# Patient Record
Sex: Female | Born: 1990 | Hispanic: No | Marital: Married | Smoking: Never smoker
Health system: Southern US, Community
[De-identification: ages and names within clinical notes are randomized; demographics above are authoritative.]

## PROBLEM LIST (undated history)

## (undated) ENCOUNTER — Inpatient Hospital Stay (HOSPITAL_COMMUNITY): Payer: Self-pay

## (undated) DIAGNOSIS — D649 Anemia, unspecified: Secondary | ICD-10-CM

## (undated) DIAGNOSIS — F4310 Post-traumatic stress disorder, unspecified: Secondary | ICD-10-CM

## (undated) DIAGNOSIS — D563 Thalassemia minor: Secondary | ICD-10-CM

## (undated) DIAGNOSIS — J45909 Unspecified asthma, uncomplicated: Secondary | ICD-10-CM

## (undated) DIAGNOSIS — A159 Respiratory tuberculosis unspecified: Secondary | ICD-10-CM

## (undated) DIAGNOSIS — A048 Other specified bacterial intestinal infections: Secondary | ICD-10-CM

## (undated) HISTORY — DX: Unspecified asthma, uncomplicated: J45.909

## (undated) HISTORY — DX: Thalassemia minor: D56.3

## (undated) HISTORY — PX: NO PAST SURGERIES: SHX2092

## (undated) NOTE — Anesthesia Preprocedure Evaluation (Signed)
 Formatting of this note might be different from the original. * No procedures listed * * No surgeons listed * * No Diagnosis Codes entered *  Review of Patient Medical Status (Active or History)  Heather Stephenson is a 41f G41P4 @ unknown gestational age (estimated 38w per OB provider physical assessment) here for ctx and leaking of bloody fluid.  Hx scant PNC, mild asthma, post partum hemorrhage  General Information (+) Pregnancy Pt denies alcohol use, Pt denies substance use  Family Anesthesia History  No family hx of: anesthesia complications  Cardiovascular  No hx of: HTN  Respiratory  (+) Asthma  Endocrine  No hx of: DM1, DM2  Post Partum  (+) Hemorrhage  Deferred Airway/Physical Exam  Deferred until day of surgery  Anesthesia Plan  Patient medications and allergies reviewed  ASA physical status: II  Beta blockers not prescribed to patient prior to admission  Anesthesia plan deferred  Lab: Pending ECG: None ordered/indicated Image: None ordered/indicated  Blood type and screen: Pending  Interview status: Chart review only      Electronically signed by Lenette Juliene Rush, CRNA at 10/20/2022  9:42 AM PDT

---

## 2014-05-18 ENCOUNTER — Inpatient Hospital Stay (HOSPITAL_COMMUNITY)
Admission: AD | Admit: 2014-05-18 | Discharge: 2014-05-18 | Disposition: A | Discharge status: Home / Self Care | Payer: Self-pay | Source: Ambulatory Visit | Attending: Obstetrics & Gynecology | Admitting: Obstetrics & Gynecology

## 2014-05-18 ENCOUNTER — Encounter (HOSPITAL_COMMUNITY): Payer: Self-pay | Admitting: *Deleted

## 2014-05-18 ENCOUNTER — Inpatient Hospital Stay (HOSPITAL_COMMUNITY): Payer: Self-pay

## 2014-05-18 DIAGNOSIS — O26899 Other specified pregnancy related conditions, unspecified trimester: Secondary | ICD-10-CM

## 2014-05-18 DIAGNOSIS — O26891 Other specified pregnancy related conditions, first trimester: Secondary | ICD-10-CM | POA: Insufficient documentation

## 2014-05-18 DIAGNOSIS — D509 Iron deficiency anemia, unspecified: Secondary | ICD-10-CM

## 2014-05-18 DIAGNOSIS — Z3A01 Less than 8 weeks gestation of pregnancy: Secondary | ICD-10-CM | POA: Insufficient documentation

## 2014-05-18 DIAGNOSIS — R109 Unspecified abdominal pain: Secondary | ICD-10-CM | POA: Insufficient documentation

## 2014-05-18 LAB — CBC
HCT: 30.8 % — ABNORMAL LOW (ref 36.0–46.0)
Hemoglobin: 10 g/dL — ABNORMAL LOW (ref 12.0–15.0)
MCH: 20.5 pg — ABNORMAL LOW (ref 26.0–34.0)
MCHC: 32.5 g/dL (ref 30.0–36.0)
MCV: 63.1 fL — ABNORMAL LOW (ref 78.0–100.0)
Platelets: 255 10*3/uL (ref 150–400)
RBC: 4.88 MIL/uL (ref 3.87–5.11)
RDW: 15.4 % (ref 11.5–15.5)
WBC: 8.7 10*3/uL (ref 4.0–10.5)

## 2014-05-18 LAB — URINALYSIS, ROUTINE W REFLEX MICROSCOPIC
Bilirubin Urine: NEGATIVE
Glucose, UA: NEGATIVE mg/dL
Hgb urine dipstick: NEGATIVE
Ketones, ur: NEGATIVE mg/dL
Leukocytes, UA: NEGATIVE
Nitrite: NEGATIVE
Protein, ur: NEGATIVE mg/dL
Specific Gravity, Urine: 1.02 (ref 1.005–1.030)
Urobilinogen, UA: 1 mg/dL (ref 0.0–1.0)
pH: 7 (ref 5.0–8.0)

## 2014-05-18 LAB — WET PREP, GENITAL
Clue Cells Wet Prep HPF POC: NONE SEEN
Trich, Wet Prep: NONE SEEN
Yeast Wet Prep HPF POC: NONE SEEN

## 2014-05-18 LAB — ABO/RH: ABO/RH(D): O POS

## 2014-05-18 LAB — HCG, QUANTITATIVE, PREGNANCY: hCG, Beta Chain, Quant, S: 802 m[IU]/mL — ABNORMAL HIGH (ref <5)

## 2014-05-18 LAB — POCT PREGNANCY, URINE: Preg Test, Ur: POSITIVE — AB

## 2014-05-18 MED ORDER — FERROUS FUMARATE-FOLIC ACID 324-1 MG PO TABS: 1.0000 | ORAL_TABLET | Freq: Every day | ORAL | Status: DC

## 2014-05-18 NOTE — MAU Note (Signed)
Pt presents to MAU with complaints of lower abdominal pain that started today, reports + HPT yesterday. Denies any vaginal bleeding

## 2014-05-18 NOTE — Discharge Instructions (Signed)

## 2014-05-18 NOTE — MAU Provider Note (Signed)
History     CSN: [id number]  Arrival date and time: 05/18/14 1608   None     Chief Complaint  Patient presents with  . Possible Pregnancy  . Abdominal Pain   HPI This is a 23 y.o. female at [redacted]w[redacted]d by LMP who presents with c/o abdominal pain.  States just moved here from [place], [place] last week. Saw a doctor there a month ago and was not pregnant. Seems very happy to be pregnant. States husband is still in [place], but later stated they were "no more, the book is closed". Moved here to live with cousins. Parents live in [place]. Has a female cousin with her today, partially translating (originally from [place], but was raised in [place]). He states that he "mated her" and now that they are pregnant, they can now get married legally.     Developed lower abdominal pain a month ago. Also c/o no bowel movement in a month.  Came today "to see if the baby was ok".  States has never had a female exam before. 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   RN Note: Pt presents to MAU with complaints of lower abdominal pain that started today, reports + HPT yesterday. Denies any vaginal bleeding      OB History    Gravida Para Term Preterm AB TAB SAB Ectopic Multiple Living   1         0      Past Medical History  Diagnosis Date  . Medical history non-contributory     Past Surgical History  Procedure Laterality Date  . No past surgeries      No family history on file.  History  Substance Use Topics  . Smoking status: Never Smoker   . Smokeless tobacco: Never Used  . Alcohol Use: No    Allergies: No Known Allergies  Prescriptions prior to admission  Medication Sig Dispense Refill Last Dose  . Prenatal Vit-Fe Fumarate-FA (PRENATAL MULTIVITAMIN) TABS tablet Take 1 tablet by mouth daily.   05/18/2014 at Unknown time    Review of Systems  Constitutional: Negative for fever, chills and malaise/fatigue.  Gastrointestinal: Positive for abdominal pain and constipation (x one month).  Negative for nausea, vomiting and diarrhea.  Genitourinary: Negative for dysuria.  Neurological: Negative for dizziness.   Physical Exam   Blood pressure 130/73, pulse 84, temperature 97.9 F (36.6 C), temperature source Oral, resp. rate 16, height 5' (1.524 m), weight 120 lb (54.432 kg), last menstrual period 04/10/2014.  Physical Exam  Constitutional: She is oriented to person, place, and time. She appears well-developed and well-nourished. No distress.  HENT:  Head: Normocephalic.  Cardiovascular: Normal rate.   Respiratory: Effort normal.  GI: Soft. She exhibits no distension and no mass. There is tenderness. There is no rebound and no guarding.  Genitourinary: No vaginal discharge found.  Exam very difficult and limited due to discomfort and severe infibulation. 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  There is a small opening in lower third which admits a swab only. Unable to tolerate bimanual or speculum exam.  Opening of infibulation would not admit a finger.   Musculoskeletal: Normal range of motion.  Neurological: She is alert and oriented to person, place, and time.  Skin: Skin is warm and dry.  Psychiatric: She has a normal mood and affect.    MAU Course  Procedures  MDM Cultures and  labs done. WIll wait for quant before doing US due to inability to do vaginal US  Results for orders placed or performed during the hospital encounter of 05/18/14 (from the past 24 hour(s))  Urinalysis, Routine w reflex microscopic     Status: None   Collection Time: 05/18/14  4:20 PM  Result Value Ref Range   Color, Urine YELLOW YELLOW   APPearance CLEAR CLEAR   Specific Gravity, Urine 1.020 1.005 - 1.030   pH 7.0 5.0 - 8.0   Glucose, UA NEGATIVE NEGATIVE mg/dL   Hgb urine dipstick NEGATIVE NEGATIVE   Bilirubin Urine NEGATIVE NEGATIVE   Ketones, ur NEGATIVE NEGATIVE mg/dL    Protein, ur NEGATIVE NEGATIVE mg/dL   Urobilinogen, UA 1.0 0.0 - 1.0 mg/dL   Nitrite NEGATIVE NEGATIVE   Leukocytes, UA NEGATIVE NEGATIVE  Pregnancy, urine POC     Status: Abnormal   Collection Time: 05/18/14  4:28 PM  Result Value Ref Range   Preg Test, Ur POSITIVE (A) NEGATIVE  Wet prep, genital     Status: Abnormal   Collection Time: 05/18/14  5:09 PM  Result Value Ref Range   Yeast Wet Prep HPF POC NONE SEEN NONE SEEN   Trich, Wet Prep NONE SEEN NONE SEEN   Clue Cells Wet Prep HPF POC NONE SEEN NONE SEEN   WBC, Wet Prep HPF POC FEW (A) NONE SEEN  CBC     Status: Abnormal   Collection Time: 05/18/14  5:15 PM  Result Value Ref Range   WBC 8.7 4.0 - 10.5 K/uL   RBC 4.88 3.87 - 5.11 MIL/uL   Hemoglobin 10.0 (L) 12.0 - 15.0 g/dL   HCT 16.130.8 (L) 09.636.0 - 04.546.0 %   MCV 63.1 (L) 78.0 - 100.0 fL   MCH 20.5 (L) 26.0 - 34.0 pg   MCHC 32.5 30.0 - 36.0 g/dL   RDW 40.915.4 81.111.5 - 91.415.5 %   Platelets 255 150 - 400 K/uL  ABO/Rh     Status: None (Preliminary result)   Collection Time: 05/18/14  5:15 PM  Result Value Ref Range   ABO/RH(D) O POS   hCG, quantitative, pregnancy     Status: Abnormal   Collection Time: 05/18/14  5:15 PM  Result Value Ref Range   hCG, Beta Chain, Quant, S 802 (H) <5 mIU/mL   [place] Ob Comp Less 14 Wks  05/18/2014   CLINICAL DATA:  Abdominal pain, rule out ectopic pregnancy. Estimated gestational age by last menstrual period equals 5 weeks 2 days. Beta HCG 802.  EXAM: OBSTETRIC <14 WK ULTRASOUND  TECHNIQUE: Transabdominal ultrasound was performed for evaluation of the gestation as well as the maternal uterus and adnexal regions.  COMPARISON:  None.  FINDINGS: Intrauterine gestational sac: Not identified  Yolk sac:  Not identified  Embryo:  Not identified  Maternal uterus/adnexae: Normal uterus and ovaries.  No free fluid.  IMPRESSION: No evidence of intrauterine gestation. Differential consideration includes early intrauterine pregnancy, spontaneous abortion in progress, or  occult ectopic pregnancy   Electronically Signed   By: Genevive BiStewart  Edmunds M.D.   On: 05/18/2014 18:54    Assessment and Plan  A:  Pregnancy at 1336w3d        Cannot rule out ectopic pregnancy       LLQ pain, most likely due to constipation   P:  Discussed results       Repeat Quant HCG in 48 hrs       US in 7-10 days    [facility] Center[facility]ILLIAMS,Savreen Gebhardt  05/18/2014, 4:58 PM

## 2014-05-19 LAB — HIV ANTIBODY (ROUTINE TESTING W REFLEX): HIV 1&2 Ab, 4th Generation: NONREACTIVE

## 2014-05-20 LAB — GC/CHLAMYDIA PROBE AMP
CT Probe RNA: NEGATIVE
GC Probe RNA: NEGATIVE

## 2014-06-16 ENCOUNTER — Inpatient Hospital Stay (HOSPITAL_COMMUNITY): Payer: Medicaid - Out of State

## 2014-06-16 ENCOUNTER — Encounter (HOSPITAL_COMMUNITY): Payer: Self-pay | Admitting: *Deleted

## 2014-06-16 ENCOUNTER — Inpatient Hospital Stay (HOSPITAL_COMMUNITY)
Admission: AD | Admit: 2014-06-16 | Discharge: 2014-06-16 | Disposition: A | Discharge status: Home / Self Care | Payer: Medicaid - Out of State | Source: Ambulatory Visit | Attending: Obstetrics & Gynecology | Admitting: Obstetrics & Gynecology

## 2014-06-16 DIAGNOSIS — O26891 Other specified pregnancy related conditions, first trimester: Secondary | ICD-10-CM | POA: Diagnosis not present

## 2014-06-16 DIAGNOSIS — O26899 Other specified pregnancy related conditions, unspecified trimester: Secondary | ICD-10-CM

## 2014-06-16 DIAGNOSIS — O219 Vomiting of pregnancy, unspecified: Secondary | ICD-10-CM

## 2014-06-16 DIAGNOSIS — R112 Nausea with vomiting, unspecified: Secondary | ICD-10-CM | POA: Diagnosis present

## 2014-06-16 DIAGNOSIS — R109 Unspecified abdominal pain: Secondary | ICD-10-CM | POA: Diagnosis present

## 2014-06-16 DIAGNOSIS — Z3A08 8 weeks gestation of pregnancy: Secondary | ICD-10-CM | POA: Insufficient documentation

## 2014-06-16 DIAGNOSIS — O9989 Other specified diseases and conditions complicating pregnancy, childbirth and the puerperium: Secondary | ICD-10-CM

## 2014-06-16 DIAGNOSIS — R51 Headache: Secondary | ICD-10-CM | POA: Diagnosis present

## 2014-06-16 LAB — URINALYSIS, ROUTINE W REFLEX MICROSCOPIC
Glucose, UA: NEGATIVE mg/dL
Hgb urine dipstick: NEGATIVE
Ketones, ur: 15 mg/dL — AB
Leukocytes, UA: NEGATIVE
Nitrite: NEGATIVE
Protein, ur: NEGATIVE mg/dL
Specific Gravity, Urine: >1.03 — ABNORMAL HIGH (ref 1.005–1.030)
Urobilinogen, UA: 0.2 mg/dL (ref 0.0–1.0)
pH: 6 (ref 5.0–8.0)

## 2014-06-16 LAB — RAPID URINE DRUG SCREEN, HOSP PERFORMED
Amphetamines: NOT DETECTED
Barbiturates: NOT DETECTED
Benzodiazepines: NOT DETECTED
Cocaine: NOT DETECTED
Opiates: NOT DETECTED
Tetrahydrocannabinol: NOT DETECTED

## 2014-06-16 MED ORDER — LACTATED RINGERS IV BOLUS (SEPSIS)
1000.0000 mL | Freq: Once | INTRAVENOUS | Status: AC
  Administered 2014-06-16: 1000 mL via INTRAVENOUS

## 2014-06-16 MED ORDER — PROMETHAZINE HCL 25 MG/ML IJ SOLN
12.5000 mg | Freq: Once | INTRAMUSCULAR | Status: AC
  Administered 2014-06-16: 12.5 mg via INTRAVENOUS
  Filled 2014-06-16: qty 1

## 2014-06-16 MED ORDER — PROMETHAZINE HCL 25 MG PO TABS
12.5000 mg | ORAL_TABLET | Freq: Four times a day (QID) | ORAL | Status: DC | PRN
Start: 2014-06-16 — End: 2016-03-30

## 2014-06-16 NOTE — MAU Note (Signed)
Bad HA & vomiting for the past month, L side pain for the past 2 weeks, also epigastric pain for 3 weeks.  Denies bleeding.

## 2014-06-16 NOTE — Discharge Instructions (Signed)

## 2014-06-16 NOTE — MAU Provider Note (Signed)
History     CSN: [id number]  Arrival date and time: 06/16/14 1326   First Provider Initiated Contact with Patient 06/16/14 1541      Chief Complaint  Patient presents with  . Headache  . Emesis During Pregnancy  . Abdominal Pain   HPI   [name] is a 23 y.o. female G1P0 at 2516w4d. She presents today with N/V, headache and abdominal pain. She has vomited 2 times in the last 24 hours. She has not eaten anything today. She does not have medication at home for vomiting.  The patient was seen 1 month ago for abdominal pain in pregnancy. She was instructed to come back in 48 hours for quant and did not come because a family member was dying. She continues to have abdominal pain that is all over her abdomen. Comes and goes at times. .  She denies vaginal bleeding.   OB History    Gravida Para Term Preterm AB TAB SAB Ectopic Multiple Living   1         0      Past Medical History  Diagnosis Date  . Medical history non-contributory     Past Surgical History  Procedure Laterality Date  . No past surgeries      History reviewed. No pertinent family history.  History  Substance Use Topics  . Smoking status: Never Smoker   . Smokeless tobacco: Never Used  . Alcohol Use: No    Allergies: No Known Allergies  Prescriptions prior to admission  Medication Sig Dispense Refill Last Dose  . Ferrous Fumarate-Folic Acid 324-1 MG TABS Take 1 tablet by mouth daily. 60 each 0   . Prenatal Vit-Fe Fumarate-FA (PRENATAL MULTIVITAMIN) TABS tablet Take 1 tablet by mouth daily.   05/18/2014 at Unknown time   Results for orders placed or performed during the hospital encounter of 06/16/14 (from the past 48 hour(s))  Urine rapid drug screen (hosp performed)     Status: None   Collection Time: 06/16/14  2:10 PM  Result Value Ref Range   Opiates NONE DETECTED NONE DETECTED   Cocaine NONE DETECTED NONE DETECTED   Benzodiazepines NONE DETECTED NONE DETECTED   Amphetamines NONE  DETECTED NONE DETECTED   Tetrahydrocannabinol NONE DETECTED NONE DETECTED   Barbiturates NONE DETECTED NONE DETECTED    Comment:        DRUG SCREEN FOR MEDICAL PURPOSES ONLY.  IF CONFIRMATION IS NEEDED FOR ANY PURPOSE, NOTIFY LAB WITHIN 5 DAYS.        LOWEST DETECTABLE LIMITS FOR URINE DRUG SCREEN Drug Class       Cutoff (ng/mL) Amphetamine      1000 Barbiturate      200 Benzodiazepine   200 Tricyclics       300 Opiates          300 Cocaine          300 THC              50 [facility]   Urinalysis, Routine w reflex microscopic     Status: Abnormal   Collection Time: 06/16/14  2:21 PM  Result Value Ref Range   Color, Urine YELLOW YELLOW   APPearance CLEAR CLEAR   Specific Gravity, Urine >1.030 (H) 1.005 - 1.030   pH 6.0 5.0 - 8.0   Glucose, UA NEGATIVE NEGATIVE mg/dL   Hgb urine dipstick NEGATIVE NEGATIVE   Bilirubin Urine SMALL (A) NEGATIVE   Ketones, ur 15 (A) NEGATIVE  mg/dL   Protein, ur NEGATIVE NEGATIVE mg/dL   Urobilinogen, UA 0.2 0.0 - 1.0 mg/dL   Nitrite NEGATIVE NEGATIVE   Leukocytes, UA NEGATIVE NEGATIVE    Comment: MICROSCOPIC NOT DONE ON URINES WITH NEGATIVE PROTEIN, BLOOD, LEUKOCYTES, NITRITE, OR GLUCOSE <1000 mg/dL.   [place] Ob Comp Less 14 Wks  06/16/2014   CLINICAL DATA:  Pregnant, left pelvic pain  EXAM: OBSTETRIC <14 WK ULTRASOUND  TECHNIQUE: Transabdominal ultrasound was performed for evaluation of the gestation as well as the maternal uterus and adnexal regions.  COMPARISON:  05/18/2014  FINDINGS: Intrauterine gestational sac: Visualized/normal in shape.  Yolk sac:  Not visualized  Embryo:  Present  Cardiac Activity: Present  Heart Rate: 179 bpm  CRL:   20.6  mm   8 w 5 d                  US EDC: 01/21/2015  Maternal uterus/adnexae: No subchorionic hemorrhage.  Bilateral ovaries are within normal limits.  No free fluid.  IMPRESSION: Single live intrauterine gestation with estimated gestational age [redacted] weeks 5 days by crown-rump length.    Electronically Signed   By: Charline BillsSriyesh  Krishnan M.D.   On: 06/16/2014 17:28    Review of Systems  Constitutional: Negative for fever and chills.  Gastrointestinal: Positive for nausea, vomiting and abdominal pain. Negative for diarrhea.  Neurological: Positive for headaches.   Physical Exam   Blood pressure 107/77, pulse 81, temperature 98 F (36.7 C), temperature source Oral, resp. rate 18, last menstrual period 04/10/2014.  Physical Exam  Constitutional: She is oriented to person, place, and time. She appears well-developed and well-nourished. No distress.  HENT:  Head: Normocephalic.  Eyes: Pupils are equal, round, and reactive to light.  Neck: Neck supple.  Cardiovascular: Normal rate and normal heart sounds.   Respiratory: Effort normal and breath sounds normal. No respiratory distress.  GI: Soft. She exhibits no distension. There is no tenderness. There is no rebound and no guarding.  Musculoskeletal: Normal range of motion.  Neurological: She is alert and oriented to person, place, and time.  Skin: Skin is warm. She is not diaphoretic.  Psychiatric: Her behavior is normal.    MAU Course  Procedures  None  MDM Wet prep GC UA shows mild dehydration LR bolus with 12.5 mg IV phenergan Patient feels much better, denies pain following infusion.   Assessment and Plan   A:  SIUP @ 4969w5d Nausea and vomiting in pregnancy  Abdominal pain in pregnancy  Headache in pregnancy   P:  Discharge home in stable condition RX: Phenergan  Start prenatal care ASAP; call the HD Pictures of US provided to patient Return to MAU if symptoms worsen Small, frequent meals BRAT diet  Tylenol as needed, as directed on the bottle   Heather HansenJennifer Irene Gifford Ballon, NP 06/16/2014 7:53 PM

## 2014-07-17 ENCOUNTER — Inpatient Hospital Stay (HOSPITAL_COMMUNITY)
Admission: AD | Admit: 2014-07-17 | Discharge: 2014-07-17 | Disposition: A | Discharge status: Home / Self Care | Payer: Medicaid - Out of State | Source: Ambulatory Visit | Attending: Obstetrics & Gynecology | Admitting: Obstetrics & Gynecology

## 2014-07-17 ENCOUNTER — Encounter (HOSPITAL_COMMUNITY): Payer: Self-pay | Admitting: *Deleted

## 2014-07-17 DIAGNOSIS — O219 Vomiting of pregnancy, unspecified: Secondary | ICD-10-CM

## 2014-07-17 DIAGNOSIS — O210 Mild hyperemesis gravidarum: Secondary | ICD-10-CM | POA: Diagnosis present

## 2014-07-17 DIAGNOSIS — Z3A14 14 weeks gestation of pregnancy: Secondary | ICD-10-CM | POA: Insufficient documentation

## 2014-07-17 MED ORDER — ONDANSETRON 4 MG PO TBDP
4.0000 mg | ORAL_TABLET | Freq: Three times a day (TID) | ORAL | Status: DC | PRN
Start: 2014-07-17 — End: 2016-03-30

## 2014-07-17 MED ORDER — FAMOTIDINE 10 MG PO CHEW: 20.0000 mg | CHEWABLE_TABLET | Freq: Every day | ORAL | Status: DC

## 2014-07-17 MED ORDER — ONDANSETRON 8 MG PO TBDP
8.0000 mg | ORAL_TABLET | Freq: Once | ORAL | Status: AC
  Administered 2014-07-17: 8 mg via ORAL
  Filled 2014-07-17: qty 1

## 2014-07-17 MED ORDER — FAMOTIDINE 20 MG PO TABS
20.0000 mg | ORAL_TABLET | Freq: Once | ORAL | Status: AC
  Administered 2014-07-17: 20 mg via ORAL
  Filled 2014-07-17: qty 1

## 2014-07-17 NOTE — MAU Provider Note (Signed)
Chief Complaint: Emesis and Heartburn   First Provider Initiated Contact with Patient 07/17/14 1912      SUBJECTIVE HPI: Heather Stephenson is a 24 y.o. G1P0 at 3179w0d by LMP who presents with exacerbation of nausea and vomiting 2 days and worsening heartburn. Vomited 6 times since last night. Can't keep anything down. Has been taking Phenergan for nausea and vomiting so far this pregnancy and has had good results but it hasn't been helping her last 2 days. Last dose at 3 PM today. Hasn't vomited since then. Hasn't tried anything for heartburn.   [facility].  Past Medical History  Diagnosis Date  . Medical history non-contributory    OB History  Gravida Para Term Preterm AB SAB TAB Ectopic Multiple Living  1         0    # Outcome Date GA Lbr Len/2nd Weight Sex Delivery Anes PTL Lv  1 Current              Past Surgical History  Procedure Laterality Date  . No past surgeries     History   Social History  . Marital Status: Married    Spouse Name: N/A    Number of Children: N/A  . Years of Education: N/A   Occupational History  . Not on file.   Social History Main Topics  . Smoking status: Never Smoker   . Smokeless tobacco: Never Used  . Alcohol Use: No  . Drug Use: No  . Sexual Activity: Yes    Birth Control/ Protection: None   Other Topics Concern  . Not on file   Social History Narrative   No current facility-administered medications on file prior to encounter.   Current Outpatient Prescriptions on File Prior to Encounter  Medication Sig Dispense Refill  . Ferrous Fumarate-Folic Acid 324-1 MG TABS Take 1 tablet by mouth daily. 60 each 0  . Prenatal Vit-Fe Fumarate-FA (PRENATAL MULTIVITAMIN) TABS tablet Take 1 tablet by mouth daily.    . promethazine (PHENERGAN) 25 MG tablet Take 0.5 tablets (12.5 mg total) by mouth every 6 (six) hours as needed for nausea or vomiting. 30 tablet 0   No Known  Allergies  ROS: Pertinent positive items in HPI. Negative for fever, chills, diarrhea, constipation, vaginal bleeding, abdominal pain.  OBJECTIVE Blood pressure 77/60, pulse 88, temperature 98.3 F (36.8 C), resp. rate 16, weight 58.06 kg (128 lb), last menstrual period 04/10/2014, SpO2 100 %. GENERAL: Well-developed, well-nourished female in no acute distress.  HEENT: Normocephalic. Mucous members moist. HEART: normal rate RESP: normal effort ABDOMEN: Soft, non-tender. Gravid, size equals dates. EXTREMITIES: Nontender, no edema NEURO: Alert and oriented SPECULUM EXAM: Deferred  Fetal heart rate 150 per Doppler.  MAU COURSE Zofran ODT and Pepcid ordered. 2007: Patient is feeling better. She has not vomited since being here. Heartburn is better.   Care of patient turned over to Thressa ShellerHeather Hogan, CNM at 7:40 PM.  1. Nausea/vomiting in pregnancy    DC home Comfort measures Continue Phenergan PRN Zofran PRN Pepcid PRN Return to MAU as needed  Follow-up Information    [facility]   Specialty:  Obstetrics and Gynecology   Why:  As scheduled   Contact information:   [address] [place] [id number] [phone]      Eli HoseHogan, Heather Donovan  [place], [place]M 07/17/2014  7:42 PM

## 2014-07-17 NOTE — Discharge Instructions (Signed)
You are [redacted] weeks pregnant at today's visit. Your estimated due date is January 15, 2015.   Morning Sickness Morning sickness is when you feel sick to your stomach (nauseous) during pregnancy. This nauseous feeling may or may not come with vomiting. It often occurs in the morning but can be a problem any time of day. Morning sickness is most common during the first trimester, but it may continue throughout pregnancy. While morning sickness is unpleasant, it is usually harmless unless you develop severe and continual vomiting (hyperemesis gravidarum). This condition requires more intense treatment.  CAUSES  The cause of morning sickness is not completely known but seems to be related to normal hormonal changes that occur in pregnancy. RISK FACTORS You are at greater risk if you:  Experienced nausea or vomiting before your pregnancy.  Had morning sickness during a previous pregnancy.  Are pregnant with more than one baby, such as twins. TREATMENT  Do not use any medicines (prescription, over-the-counter, or herbal) for morning sickness without first talking to your health care provider. Your health care provider may prescribe or recommend:  Vitamin B6 supplements.  Anti-nausea medicines.  The herbal medicine ginger. HOME CARE INSTRUCTIONS   Only take over-the-counter or prescription medicines as directed by your health care provider.  Taking multivitamins before getting pregnant can prevent or decrease the severity of morning sickness in most women.  Eat a piece of dry toast or unsalted crackers before getting out of bed in the morning.  Eat five or six small meals a day.  Eat dry and bland foods (rice, baked potato). Foods high in carbohydrates are often helpful.  Do not drink liquids with your meals. Drink liquids between meals.  Avoid greasy, fatty, and spicy foods.  Get someone to cook for you if the smell of any food causes nausea and vomiting.  If you feel nauseous after  taking prenatal vitamins, take the vitamins at night or with a snack.  Snack on protein foods (nuts, yogurt, cheese) between meals if you are hungry.  Eat unsweetened gelatins for desserts.  Wearing an acupressure wristband (worn for sea sickness) may be helpful.  Acupuncture may be helpful.  Do not smoke.  Get a humidifier to keep the air in your house free of odors.  Get plenty of fresh air. SEEK MEDICAL CARE IF:   Your home remedies are not working, and you need medicine.  You feel dizzy Second Trimester of Pregnancy The second trimester is from week 13 through week 28, months 4 through 6. The second trimester is often a time when you feel your best. Your body has also adjusted to being pregnant, and you begin to feel better physically. Usually, morning sickness has lessened or quit completely, you may have more energy, and you may have an increase in appetite. The second trimester is also a time when the fetus is growing rapidly. At the end of the sixth month, the fetus is about 9 inches long and weighs about 1 pounds. You will likely begin to feel the baby move (quickening) between 18 and 20 weeks of the pregnancy. BODY CHANGES Your body goes through many changes during pregnancy. The changes vary from woman to woman.  Your weight will continue to increase. You will notice your lower abdomen bulging out. You may begin to get stretch marks on your hips, abdomen, and breasts. You may develop headaches that can be relieved by medicines approved by your health care provider. You may urinate more often because the fetus  is pressing on your bladder. You may develop or continue to have heartburn as a result of your pregnancy. You may develop constipation because certain hormones are causing the muscles that push waste through your intestines to slow down. You may develop hemorrhoids or swollen, bulging veins (varicose veins). You may have back pain because of the weight gain and  pregnancy hormones relaxing your joints between the bones in your pelvis and as a result of a shift in weight and the muscles that support your balance. Your breasts will continue to grow and be tender. Your gums may bleed and may be sensitive to brushing and flossing. Dark spots or blotches (chloasma, mask of pregnancy) may develop on your face. This will likely fade after the baby is born. A dark line from your belly button to the pubic area (linea nigra) may appear. This will likely fade after the baby is born. You may have changes in your hair. These can include thickening of your hair, rapid growth, and changes in texture. Some women also have hair loss during or after pregnancy, or hair that feels dry or thin. Your hair will most likely return to normal after your baby is born. WHAT TO EXPECT AT YOUR PRENATAL VISITS During a routine prenatal visit: You will be weighed to make sure you and the fetus are growing normally. Your blood pressure will be taken. Your abdomen will be measured to track your baby's growth. The fetal heartbeat will be listened to. Any test results from the previous visit will be discussed. Your health care provider may ask you: How you are feeling. If you are feeling the baby move. If you have had any abnormal symptoms, such as leaking fluid, bleeding, severe headaches, or abdominal cramping. If you have any questions. Other tests that may be performed during your second trimester include: Blood tests that check for: Low iron levels (anemia). Gestational diabetes (between 24 and 28 weeks). Rh antibodies. Urine tests to check for infections, diabetes, or protein in the urine. An ultrasound to confirm the proper growth and development of the baby. An amniocentesis to check for possible genetic problems. Fetal screens for spina bifida and Down syndrome. HOME CARE INSTRUCTIONS  Avoid all smoking, herbs, alcohol, and unprescribed drugs. These chemicals affect the  formation and growth of the baby. Follow your health care provider's instructions regarding medicine use. There are medicines that are either safe or unsafe to take during pregnancy. Exercise only as directed by your health care provider. Experiencing uterine cramps is a good sign to stop exercising. Continue to eat regular, healthy meals. Wear a good support bra for breast tenderness. Do not use hot tubs, steam rooms, or saunas. Wear your seat belt at all times when driving. Avoid raw meat, uncooked cheese, cat litter boxes, and soil used by cats. These carry germs that can cause birth defects in the baby. Take your prenatal vitamins. Try taking a stool softener (if your health care provider approves) if you develop constipation. Eat more high-fiber foods, such as fresh vegetables or fruit and whole grains. Drink plenty of fluids to keep your urine clear or pale yellow. Take warm sitz baths to soothe any pain or discomfort caused by hemorrhoids. Use hemorrhoid cream if your health care provider approves. If you develop varicose veins, wear support hose. Elevate your feet for 15 minutes, 3-4 times a day. Limit salt in your diet. Avoid heavy lifting, wear low heel shoes, and practice good posture. Rest with your legs elevated if you  have leg cramps or low back pain. Visit your dentist if you have not gone yet during your pregnancy. Use a soft toothbrush to brush your teeth and be gentle when you floss. A sexual relationship may be continued unless your health care provider directs you otherwise. Continue to go to all your prenatal visits as directed by your health care provider. SEEK MEDICAL CARE IF:  You have dizziness. You have mild pelvic cramps, pelvic pressure, or nagging pain in the abdominal area. You have persistent nausea, vomiting, or diarrhea. You have a bad smelling vaginal discharge. You have pain with urination. SEEK IMMEDIATE MEDICAL CARE IF:  You have a fever. You are leaking  fluid from your vagina. You have spotting or bleeding from your vagina. You have severe abdominal cramping or pain. You have rapid weight gain or loss. You have shortness of breath with chest pain. You notice sudden or extreme swelling of your face, hands, ankles, feet, or legs. You have not felt your baby move in over an hour. You have severe headaches that do not go away with medicine. You have vision changes. Document Released: 06/07/2001 Document Revised: 06/18/2013 Document Reviewed: 08/14/2012 [facility] Patient Information 2015 [place], [place]. This information is not intended to replace advice given to you by your health care provider. Make sure you discuss any questions you have with your health care provider.   or lightheaded.  You are losing weight. SEEK IMMEDIATE MEDICAL CARE IF:   You have persistent and uncontrolled nausea and vomiting.  You pass out (faint). MAKE SURE YOU:  Understand these instructions.  Will watch your condition.  Will get help right away if you are not doing well or get worse. Document Released: 08/04/2006 Document Revised: 06/18/2013 Document Reviewed: 11/28/2012 [facility] Patient Information 2015 [place], [place]. This information is not intended to replace advice given to you by your health care provider. Make sure you discuss any questions you have with your health care provider.

## 2014-07-17 NOTE — MAU Note (Signed)
No reaction to medications given. Pt states that she feels better and is ready for discharge home.

## 2014-07-17 NOTE — MAU Note (Signed)
Pt reports she has been vomiting  Since last night 5-6 xs. Cna't keep anything down. Reports heartburn as well.

## 2014-07-28 ENCOUNTER — Encounter: Payer: Medicaid - Out of State | Admitting: Obstetrics & Gynecology

## 2014-07-28 ENCOUNTER — Encounter: Payer: Self-pay | Admitting: Obstetrics & Gynecology

## 2014-07-28 ENCOUNTER — Telehealth: Payer: Self-pay | Admitting: Obstetrics & Gynecology

## 2014-07-28 NOTE — Telephone Encounter (Signed)
Called patient left message for patient to return call to clinics. Mailing certified letter to patient.

## 2014-08-26 ENCOUNTER — Encounter: Payer: Self-pay | Admitting: General Practice

## 2015-03-23 ENCOUNTER — Encounter (HOSPITAL_COMMUNITY): Payer: Self-pay | Admitting: *Deleted

## 2015-11-04 IMAGING — US US OB COMP LESS 14 WK
1 series · 14 of 23 positions shown · non-contrast
Comparison: None.

CLINICAL DATA: Abdominal pain, rule out ectopic pregnancy.
Estimated gestational age by last menstrual period equals 5 weeks 2
days. Beta HCG 802.

EXAM:
OBSTETRIC <14 WK ULTRASOUND
TECHNIQUE: Transabdominal ultrasound was performed for evaluation of the
gestation as well as the maternal uterus and adnexal regions.

[Series 1: us ob comp less 14 wks · 14 of 23 slices shown]
[im 1/23]
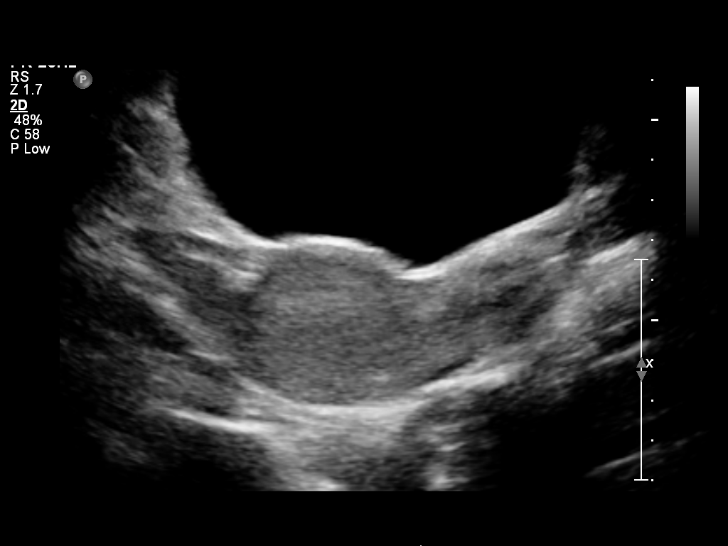
[im 3/23]
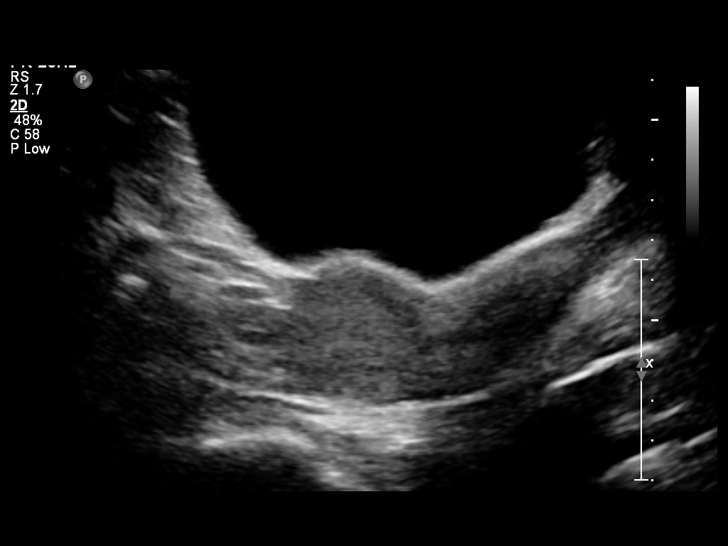
[im 5/23]
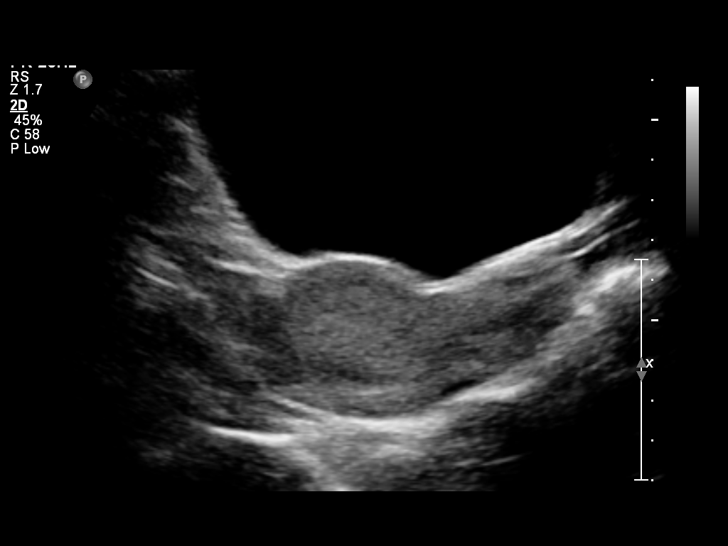
[im 6/23]
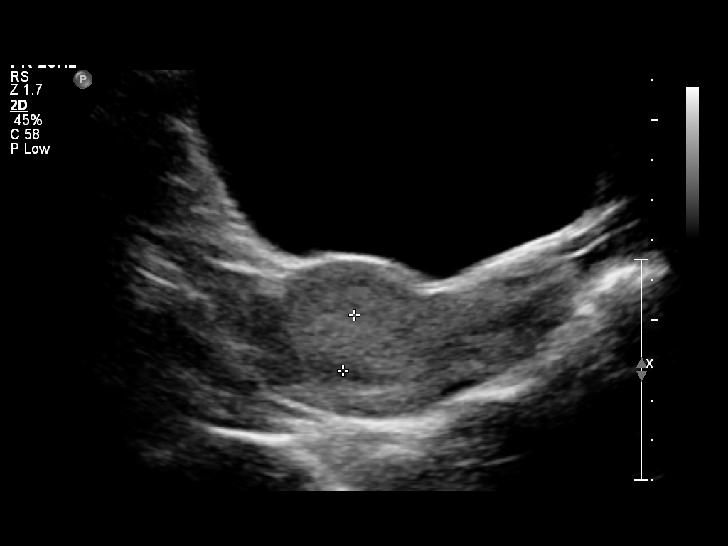
[im 8/23]
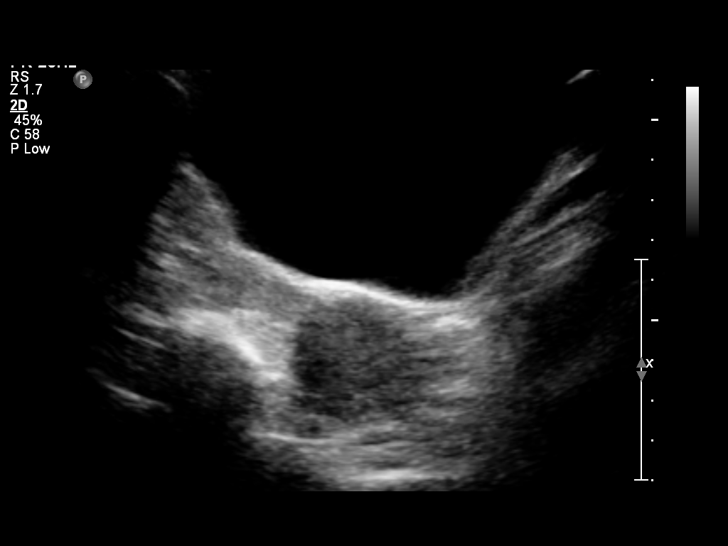
[im 10/23]
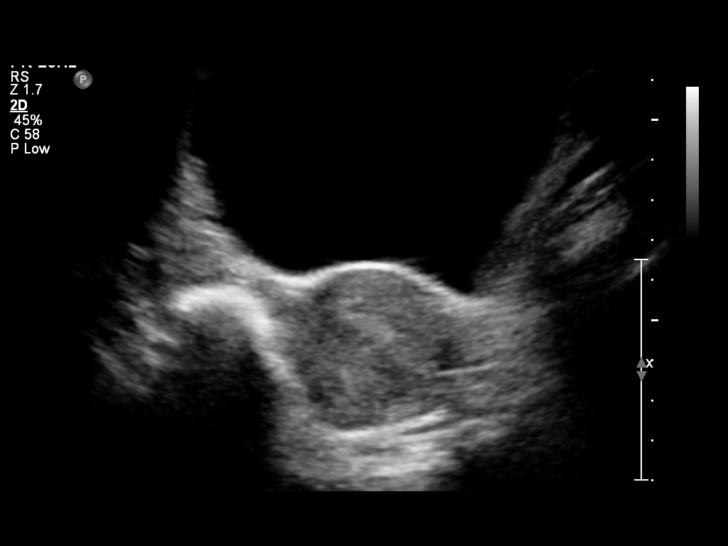
[im 11/23]
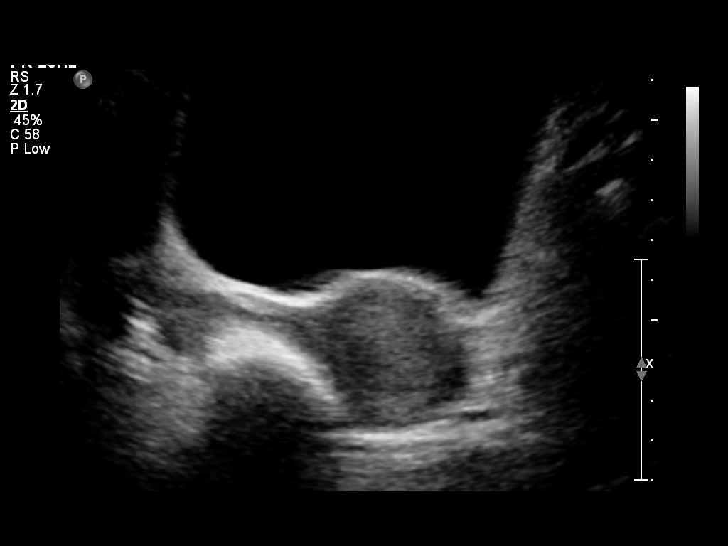
[im 13/23]
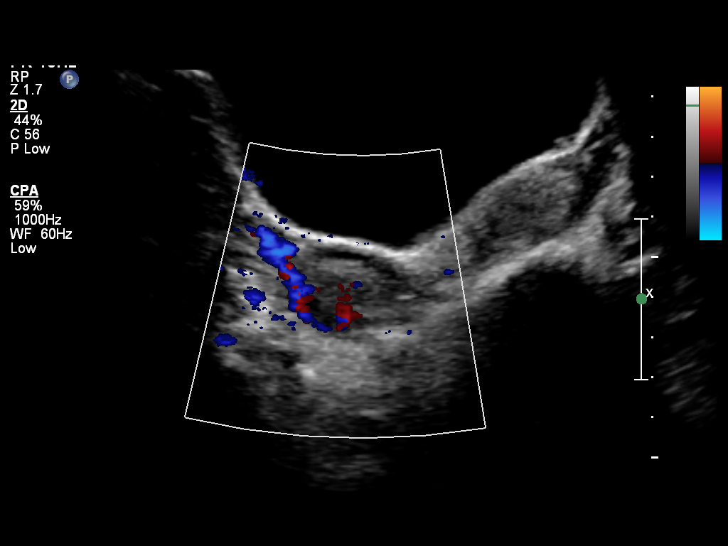
[im 14/23]
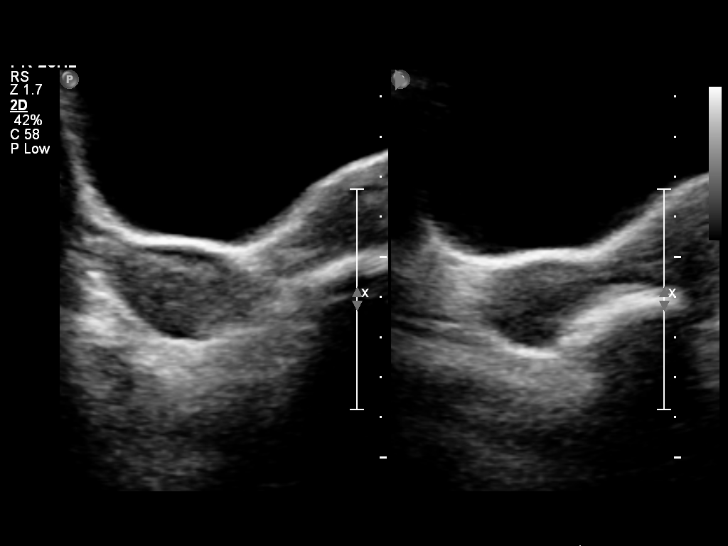
[im 16/23]
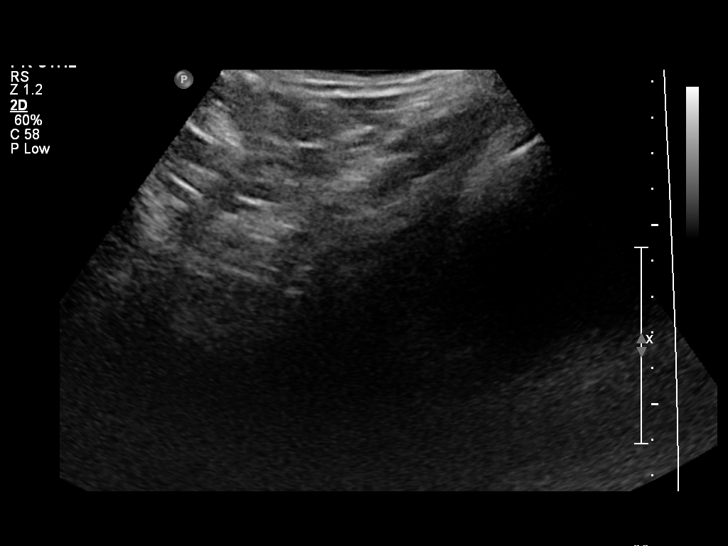
[im 18/23]
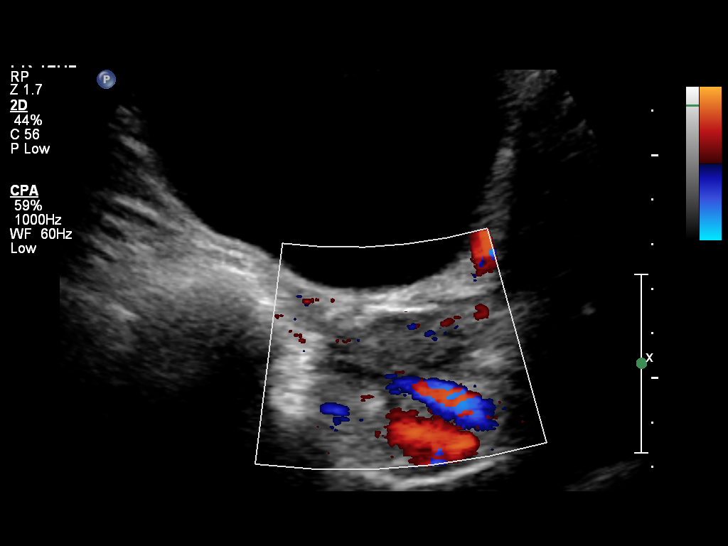
[im 19/23]
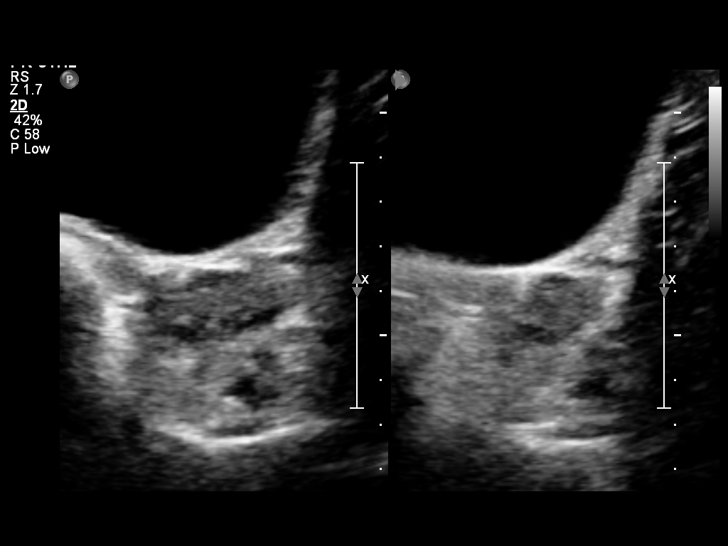
[im 21/23]
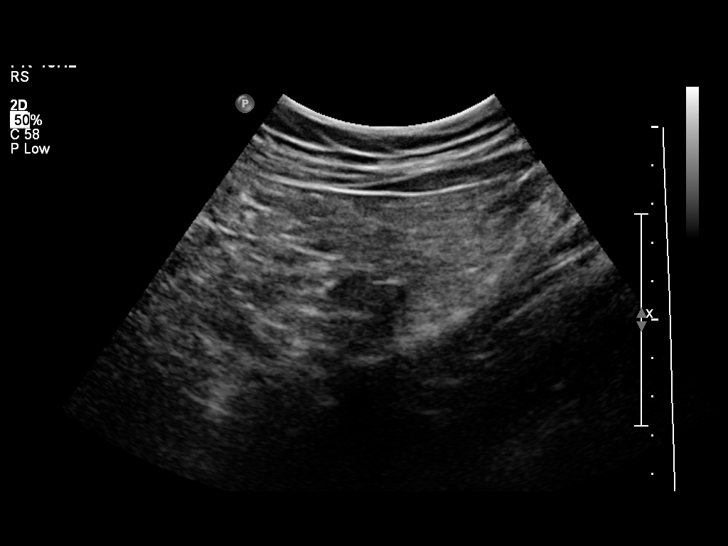
[im 23/23]
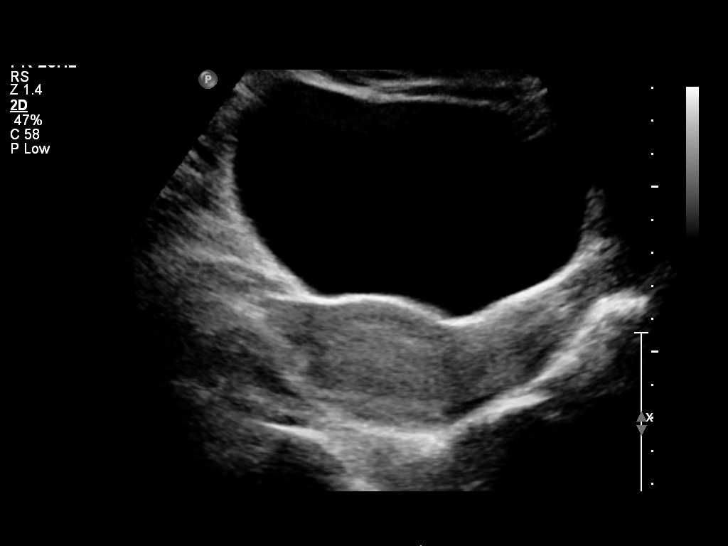

[14 of 23 positions shown; findings below may reference images not displayed]

FINDINGS: Intrauterine gestational sac: Not identified

Yolk sac:  Not identified

Embryo:  Not identified

Maternal uterus/adnexae: Normal uterus and ovaries.  No free fluid.
IMPRESSION: No evidence of intrauterine gestation. Differential consideration
includes early intrauterine pregnancy, spontaneous abortion in
progress, or occult ectopic pregnancy

## 2015-12-03 IMAGING — US US OB COMP LESS 14 WK
1 series · 14 of 27 positions shown · non-contrast
Comparison: 05/18/2014

CLINICAL DATA: Pregnant, left pelvic pain

EXAM:
OBSTETRIC <14 WK ULTRASOUND
TECHNIQUE: Transabdominal ultrasound was performed for evaluation of the
gestation as well as the maternal uterus and adnexal regions.

[Series 1: us ob transvaginal · 14 of 27 slices shown]
[im 1/27]
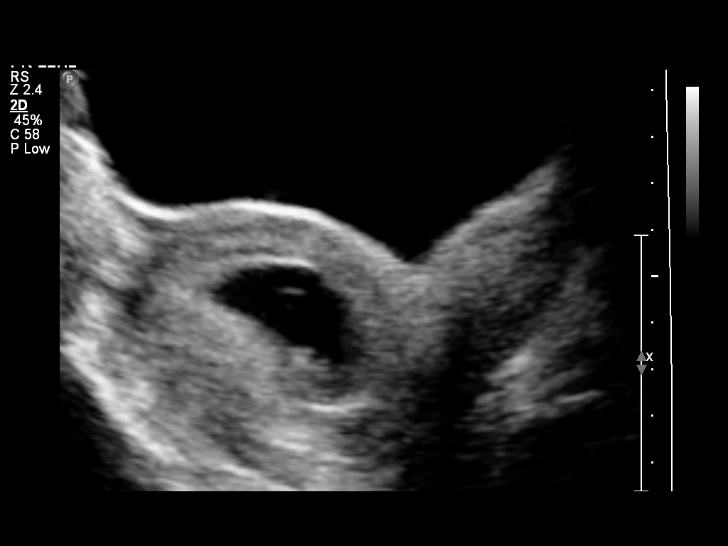
[im 3/27]
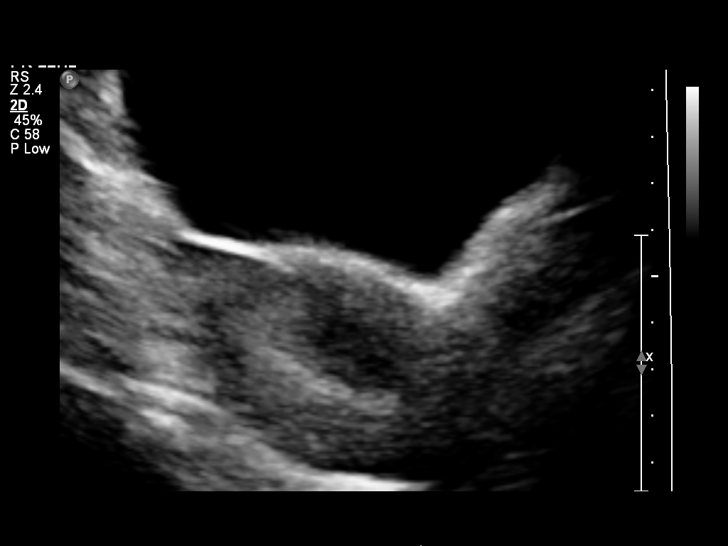
[im 5/27]
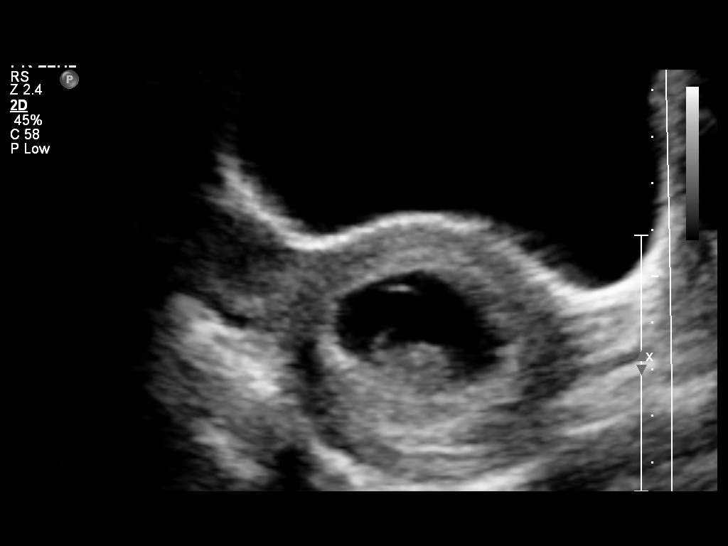
[im 7/27]
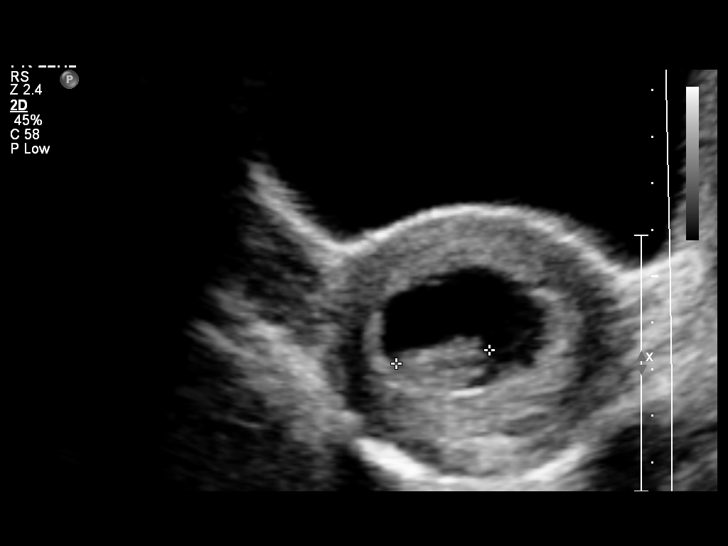
[im 9/27]
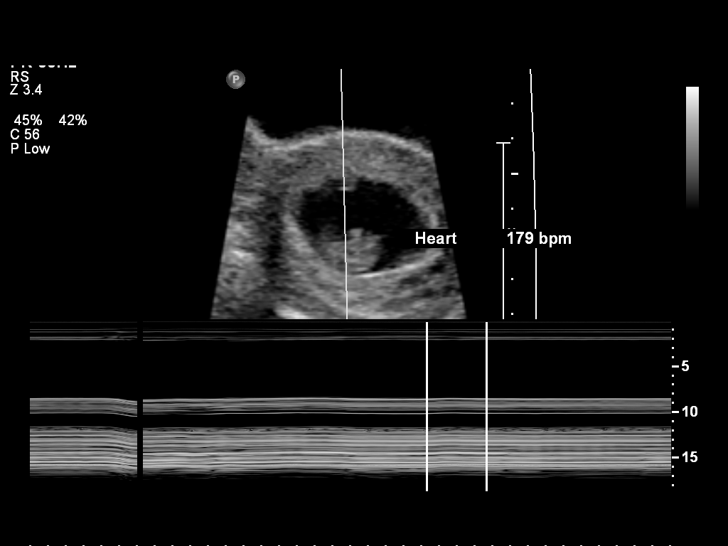
[im 11/27]
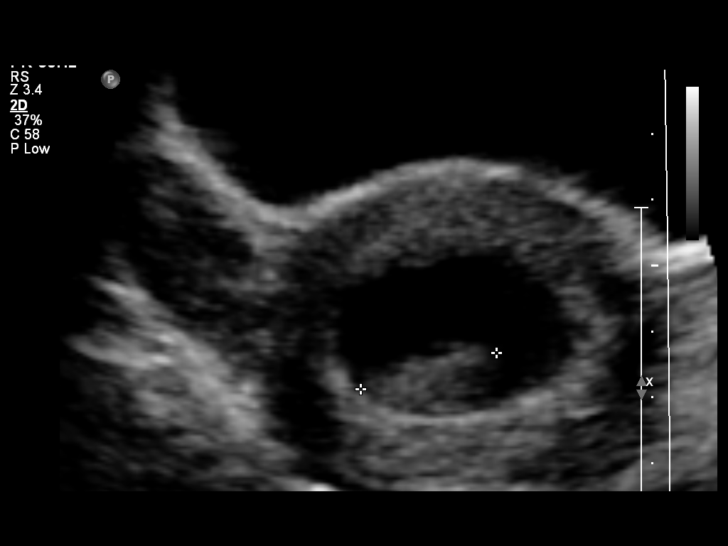
[im 13/27]
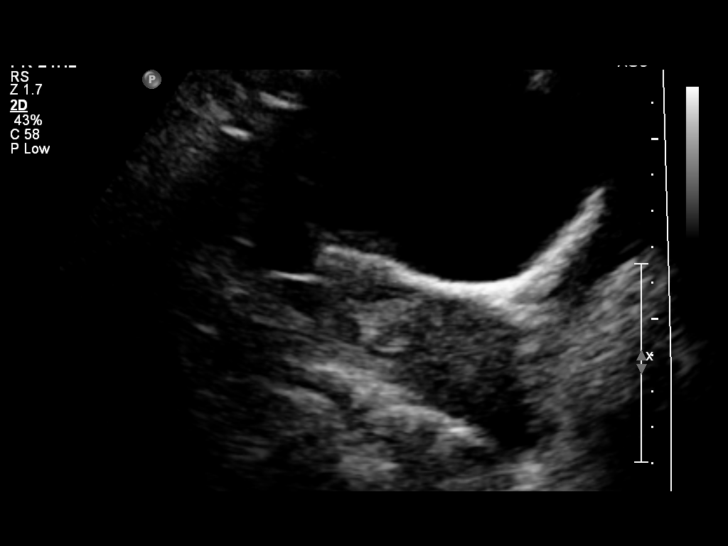
[im 15/27]
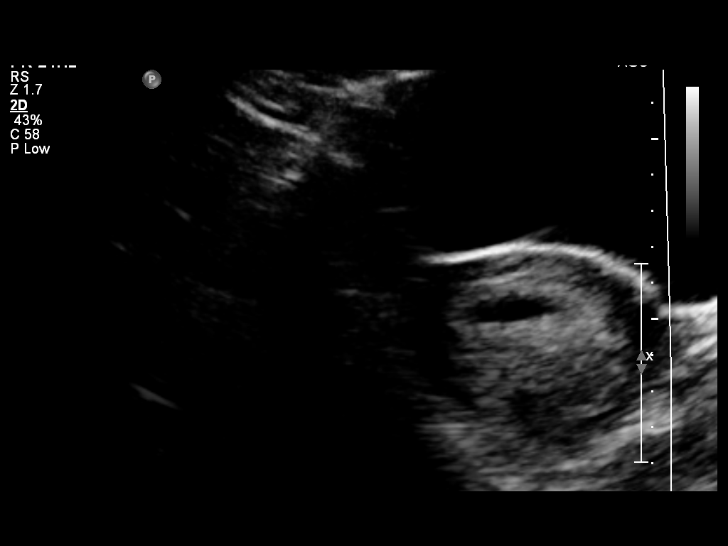
[im 17/27]
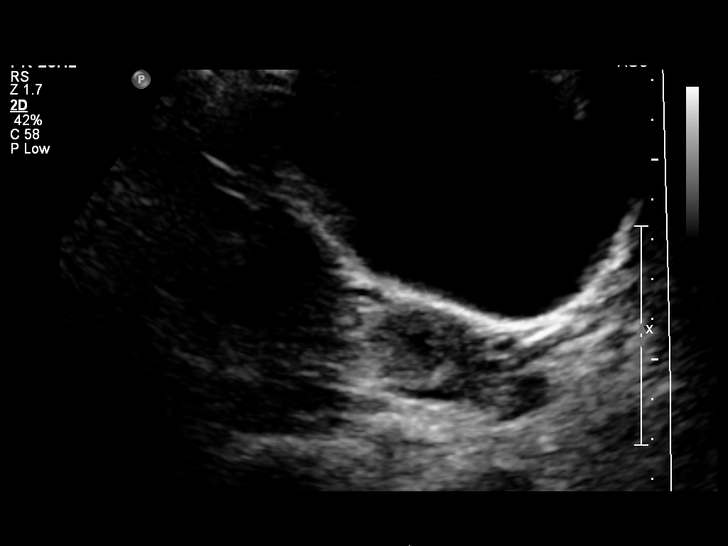
[im 19/27]
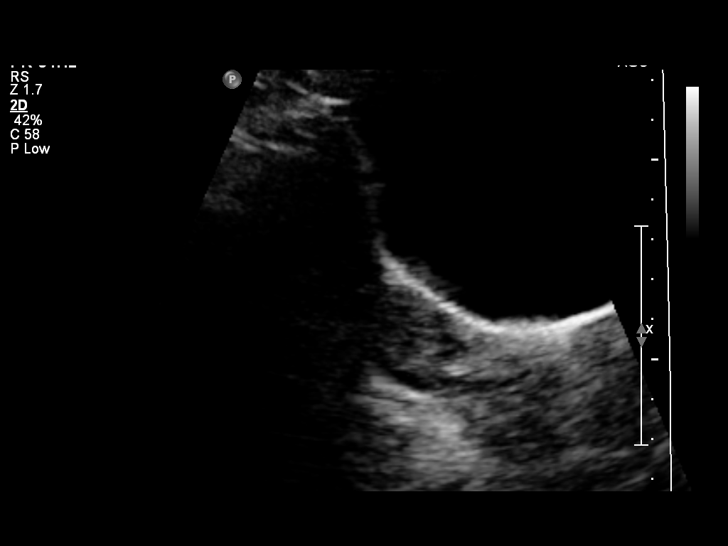
[im 21/27]
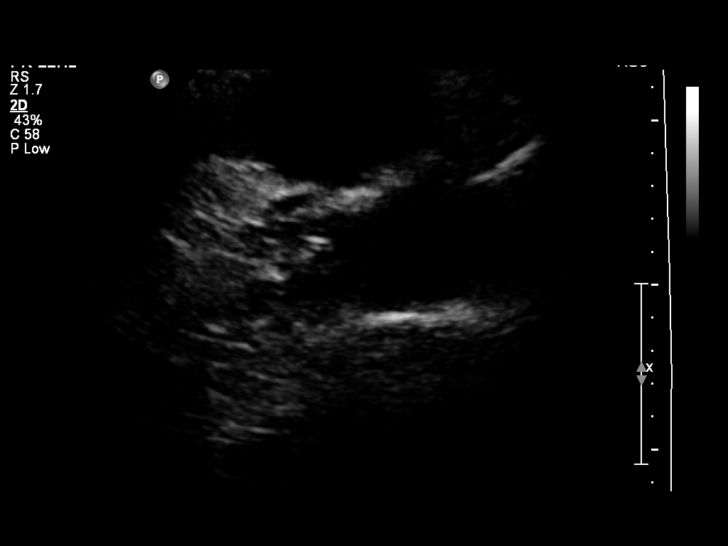
[im 23/27]
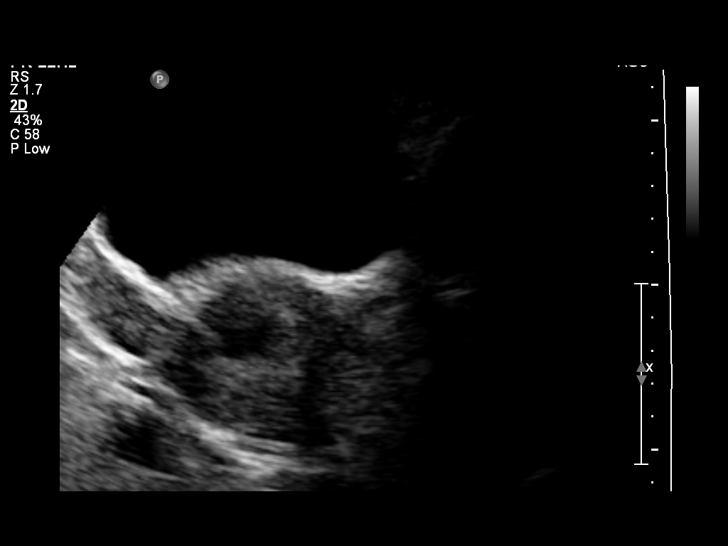
[im 25/27]
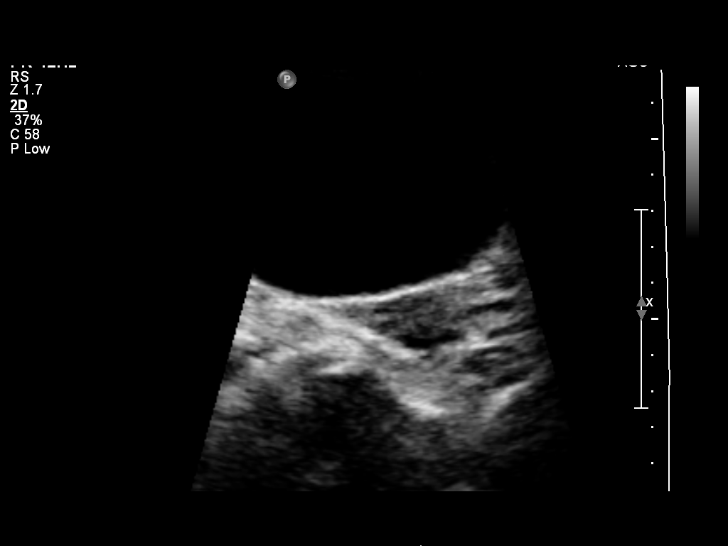
[im 27/27]
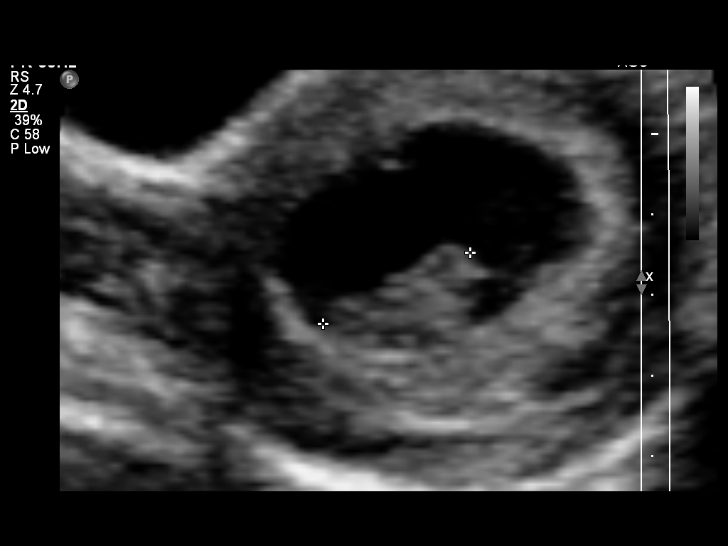

[14 of 27 positions shown; findings below may reference images not displayed]

FINDINGS: Intrauterine gestational sac: Visualized/normal in shape.

Yolk sac:  Not visualized

Embryo:  Present

Cardiac Activity: Present

Heart Rate: 179 bpm

CRL:   20.6  mm   8 w 5 d                  US EDC: 01/21/2015

Maternal uterus/adnexae: No subchorionic hemorrhage.

Bilateral ovaries are within normal limits.

No free fluid.
IMPRESSION: Single live intrauterine gestation with estimated gestational age 8
weeks 5 days by crown-rump length.

## 2016-03-29 ENCOUNTER — Inpatient Hospital Stay (HOSPITAL_COMMUNITY)
Admission: AD | Admit: 2016-03-29 | Discharge: 2016-03-30 | Disposition: A | Discharge status: Home / Self Care | Payer: Medicaid - Out of State | Source: Ambulatory Visit | Attending: Obstetrics and Gynecology | Admitting: Obstetrics and Gynecology

## 2016-03-29 ENCOUNTER — Inpatient Hospital Stay (HOSPITAL_COMMUNITY): Payer: Medicaid - Out of State

## 2016-03-29 ENCOUNTER — Encounter (HOSPITAL_COMMUNITY): Payer: Self-pay

## 2016-03-29 DIAGNOSIS — N939 Abnormal uterine and vaginal bleeding, unspecified: Secondary | ICD-10-CM | POA: Insufficient documentation

## 2016-03-29 DIAGNOSIS — R1032 Left lower quadrant pain: Secondary | ICD-10-CM | POA: Insufficient documentation

## 2016-03-29 DIAGNOSIS — N946 Dysmenorrhea, unspecified: Secondary | ICD-10-CM

## 2016-03-29 LAB — CBC WITH DIFFERENTIAL/PLATELET
Basophils Absolute: 0 10*3/uL (ref 0.0–0.1)
Basophils Relative: 0 %
Eosinophils Absolute: 0.1 10*3/uL (ref 0.0–0.7)
Eosinophils Relative: 1 %
HCT: 33.7 % — ABNORMAL LOW (ref 36.0–46.0)
Hemoglobin: 10.8 g/dL — ABNORMAL LOW (ref 12.0–15.0)
Lymphocytes Relative: 28 %
Lymphs Abs: 2.3 10*3/uL (ref 0.7–4.0)
MCH: 19.5 pg — ABNORMAL LOW (ref 26.0–34.0)
MCHC: 32 g/dL (ref 30.0–36.0)
MCV: 60.9 fL — ABNORMAL LOW (ref 78.0–100.0)
Monocytes Absolute: 0.3 10*3/uL (ref 0.1–1.0)
Monocytes Relative: 3 %
Neutro Abs: 5.5 10*3/uL (ref 1.7–7.7)
Neutrophils Relative %: 67 %
Platelets: 241 10*3/uL (ref 150–400)
RBC: 5.53 MIL/uL — ABNORMAL HIGH (ref 3.87–5.11)
RDW: 16.3 % — ABNORMAL HIGH (ref 11.5–15.5)
WBC: 8.1 10*3/uL (ref 4.0–10.5)

## 2016-03-29 LAB — URINALYSIS, ROUTINE W REFLEX MICROSCOPIC
Bilirubin Urine: NEGATIVE
Glucose, UA: NEGATIVE mg/dL
Ketones, ur: NEGATIVE mg/dL
Nitrite: NEGATIVE
Protein, ur: NEGATIVE mg/dL
Specific Gravity, Urine: <1.005 — ABNORMAL LOW (ref 1.005–1.030)
pH: 6 (ref 5.0–8.0)

## 2016-03-29 LAB — WET PREP, GENITAL
Clue Cells Wet Prep HPF POC: NONE SEEN
Sperm: NONE SEEN
Trich, Wet Prep: NONE SEEN
WBC, Wet Prep HPF POC: NONE SEEN
Yeast Wet Prep HPF POC: NONE SEEN

## 2016-03-29 LAB — URINE MICROSCOPIC-ADD ON

## 2016-03-29 LAB — POCT PREGNANCY, URINE: Preg Test, Ur: NEGATIVE

## 2016-03-29 NOTE — MAU Note (Signed)
Patient presents with vaginal bleeding and left lower quadrant pain that started today.

## 2016-03-30 DIAGNOSIS — N946 Dysmenorrhea, unspecified: Secondary | ICD-10-CM

## 2016-03-30 LAB — GC/CHLAMYDIA PROBE AMP (~~LOC~~) NOT AT ARMC
Chlamydia: NEGATIVE
Neisseria Gonorrhea: NEGATIVE

## 2016-03-30 LAB — HIV ANTIBODY (ROUTINE TESTING W REFLEX): HIV Screen 4th Generation wRfx: NONREACTIVE

## 2016-03-30 MED ORDER — IBUPROFEN 600 MG PO TABS: 600.0000 mg | ORAL_TABLET | Freq: Four times a day (QID) | ORAL | 1 refills | Status: DC | PRN

## 2016-03-30 NOTE — MAU Provider Note (Signed)
Chief Complaint: Vaginal Bleeding   First Provider Initiated Contact with Patient 03/29/16 2021     SUBJECTIVE HPI: Heather Stephenson is a 25 y.o. G2P2 female who presents to Maternity Admissions reporting left lower quadrant pain today and heavy bleeding. Concern for possible pregnancy. LMP ~3 weeks ago. TTC. Sexually active. Not using BC. ~ 3 months postpartum.    Location: LLQ Quality: sharp Severity: Moderate Duration: <24 hours Context: None  Timing: Intermittent Modifying factors: None. Hasn't tried anything for the pain.  Associated signs and symptoms: Vaginal bleeding heavier than normal periods. Has decreased significantly since this morning. Neg fore fever, chills, vaginal discharge, urinary complaints, or GI complaints.   Past Medical History:  Diagnosis Date  . Medical history non-contributory    OB History  Gravida Para Term Preterm AB Living  [id number]  Past Surgical History:  Procedure Laterality Date  . NO PAST SURGERIES     Social History   Social History  . Marital status: Married    Spouse name: N/A  . Number of children: 2  . Years of education: N/A   Occupational History  . Not on file.   Social History Main Topics  . Smoking status: Never Smoker  . Smokeless tobacco: Never Used  . Alcohol use No  . Drug use: No  . Sexual activity: Yes    Birth control/ protection: None   Other Topics Concern  . Not on file   Social History Narrative  . No narrative on file   No current facility-administered medications on file prior to encounter.    Current Outpatient Prescriptions on File Prior to Encounter  Medication Sig Dispense Refill  . Prenatal Vit-Fe Fumarate-FA (PRENATAL MULTIVITAMIN) TABS tablet Take 1 tablet by mouth daily.    . famotidine (PEPCID AC) 10 MG chewable tablet Chew 2 tablets (20 mg total) by mouth daily. (Patient not taking: Reported on 03/29/2016) 30 tablet 0  . Ferrous Fumarate-Folic Acid 324-1 MG TABS Take 1 tablet by mouth daily.  (Patient not taking: Reported on 03/29/2016) 60 each 0  . ondansetron (ZOFRAN ODT) 4 MG disintegrating tablet Take 1 tablet (4 mg total) by mouth every 8 (eight) hours as needed for nausea or vomiting. (Patient not taking: Reported on 03/29/2016) 20 tablet 0  . promethazine (PHENERGAN) 25 MG tablet Take 0.5 tablets (12.5 mg total) by mouth every 6 (six) hours as needed for nausea or vomiting. (Patient not taking: Reported on 03/29/2016) 30 tablet 0   Allergies  Allergen Reactions  . Beef-Derived Products Itching and Nausea And Vomiting  . Chicken Allergy Itching    I have reviewed the past Medical Hx, Surgical Hx, Social Hx, Allergies and Medications.   Review of Systems  Constitutional: Negative for appetite change, chills and fever.  Gastrointestinal: Positive for abdominal pain. Negative for abdominal distention, blood in stool, constipation, diarrhea, nausea and vomiting.  Genitourinary: Positive for menstrual problem and pelvic pain. Negative for difficulty urinating, dysuria, flank pain, frequency, hematuria, urgency, vaginal bleeding and vaginal discharge.  Musculoskeletal: Negative for back pain.  Skin: Negative for pallor.  Neurological: Negative for dizziness.    OBJECTIVE Patient Vitals for the past 24 hrs:  BP Temp Temp src Pulse Resp  03/30/16 0007 111/90 98.1 F (36.7 C) Oral 99 16  03/29/16 1949 121/81 98.4 F (36.9 C) Oral 92 16   Constitutional: Well-developed, well-nourished female in no acute distress.  Skin: No pallor Cardiovascular: normal rate Respiratory: normal rate and effort.  GI: Abd soft, non-tender, Pos BS  x 4 MS: Extremities nontender, no edema, normal ROM Neurologic: Alert and oriented x 4.  GU: Neg CVAT.  SPECULUM EXAM: Well-healed female circ that appears to have be torn and healed, physiologic discharge, small amount of bright red blood noted, cervix clean  BIMANUAL: cervix closed; uterus normal size, no adnexal tenderness or masses. No CMT.  LAB  RESULTS Results for orders placed or performed during the hospital encounter of 03/29/16 (from the past 24 hour(s))  Urinalysis, Routine w reflex microscopic (not [facility])     Status: Abnormal   Collection Time: 03/29/16  7:15 PM  Result Value Ref Range   Color, Urine AMBER (A) YELLOW   APPearance HAZY (A) CLEAR   Specific Gravity, Urine <1.005 (L) 1.005 - 1.030   pH 6.0 5.0 - 8.0   Glucose, UA NEGATIVE NEGATIVE mg/dL   Hgb urine dipstick LARGE (A) NEGATIVE   Bilirubin Urine NEGATIVE NEGATIVE   Ketones, ur NEGATIVE NEGATIVE mg/dL   Protein, ur NEGATIVE NEGATIVE mg/dL   Nitrite NEGATIVE NEGATIVE   Leukocytes, UA SMALL (A) NEGATIVE  Urine microscopic-add on     Status: Abnormal   Collection Time: 03/29/16  7:15 PM  Result Value Ref Range   Squamous Epithelial / LPF 0-5 (A) NONE SEEN   WBC, UA 6-30 0 - 5 WBC/hpf   RBC / HPF TOO NUMEROUS TO COUNT 0 - 5 RBC/hpf   Bacteria, UA FEW (A) NONE SEEN  Pregnancy, urine POC     Status: None   Collection Time: 03/29/16  7:22 PM  Result Value Ref Range   Preg Test, Ur NEGATIVE NEGATIVE  CBC with Differential/Platelet     Status: Abnormal   Collection Time: 03/29/16  8:25 PM  Result Value Ref Range   WBC 8.1 4.0 - 10.5 K/uL   RBC 5.53 (H) 3.87 - 5.11 MIL/uL   Hemoglobin 10.8 (L) 12.0 - 15.0 g/dL   HCT 81.133.7 (L) 91.436.0 - 78.246.0 %   MCV 60.9 (L) 78.0 - 100.0 fL   MCH 19.5 (L) 26.0 - 34.0 pg   MCHC 32.0 30.0 - 36.0 g/dL   RDW 95.616.3 (H) 21.311.5 - 08.615.5 %   Platelets 241 150 - 400 K/uL   Neutrophils Relative % 67 %   Neutro Abs 5.5 1.7 - 7.7 K/uL   Lymphocytes Relative 28 %   Lymphs Abs 2.3 0.7 - 4.0 K/uL   Monocytes Relative 3 %   Monocytes Absolute 0.3 0.1 - 1.0 K/uL   Eosinophils Relative 1 %   Eosinophils Absolute 0.1 0.0 - 0.7 K/uL   Basophils Relative 0 %   Basophils Absolute 0.0 0.0 - 0.1 K/uL  Wet prep, genital     Status: None   Collection Time: 03/29/16  8:30 PM  Result Value Ref Range   Yeast Wet Prep HPF POC NONE SEEN NONE SEEN    Trich, Wet Prep NONE SEEN NONE SEEN   Clue Cells Wet Prep HPF POC NONE SEEN NONE SEEN   WBC, Wet Prep HPF POC NONE SEEN NONE SEEN   Sperm NONE SEEN     IMAGING [place] Transvaginal Non-ob  Result Date: 03/30/2016 CLINICAL DATA:  Bleeding and left lower quadrant pain EXAM: TRANSABDOMINAL AND TRANSVAGINAL ULTRASOUND OF PELVIS TECHNIQUE: Both transabdominal and transvaginal ultrasound examinations of the pelvis were performed. Transabdominal technique was performed for global imaging of the pelvis including uterus, ovaries, adnexal regions, and pelvic cul-de-sac. It was necessary to proceed with endovaginal exam following the transabdominal exam to visualize the uterus,  endometrium and both ovaries. COMPARISON:  Prior exams were pregnancy related studies of the pelvis FINDINGS: Uterus Measurements: 7.3 x 4 x 4.1 cm and slightly anteflexed. No fibroids or other mass visualized. Endometrium Thickness: 8 mm.  Unremarkable Right ovary Measurements: 3.3 x 1.7 x 1.3 cm. Normal appearance/no adnexal mass. Left ovary Measurements: 3.2 x 2 x 1.4 cm. Normal appearance/no adnexal mass. Other findings No abnormal free fluid. IMPRESSION: Unremarkable pelvic ultrasound. Electronically Signed   By: Tollie Eth M.D.   On: 03/30/2016 00:06   US Pelvis Complete  Result Date: 03/30/2016 CLINICAL DATA:  Bleeding and left lower quadrant pain EXAM: TRANSABDOMINAL AND TRANSVAGINAL ULTRASOUND OF PELVIS TECHNIQUE: Both transabdominal and transvaginal ultrasound examinations of the pelvis were performed. Transabdominal technique was performed for global imaging of the pelvis including uterus, ovaries, adnexal regions, and pelvic cul-de-sac. It was necessary to proceed with endovaginal exam following the transabdominal exam to visualize the uterus, endometrium and both ovaries. COMPARISON:  Prior exams were pregnancy related studies of the pelvis FINDINGS: Uterus Measurements: 7.3 x 4 x 4.1 cm and slightly anteflexed. No fibroids or  other mass visualized. Endometrium Thickness: 8 mm.  Unremarkable Right ovary Measurements: 3.3 x 1.7 x 1.3 cm. Normal appearance/no adnexal mass. Left ovary Measurements: 3.2 x 2 x 1.4 cm. Normal appearance/no adnexal mass. Other findings No abnormal free fluid. IMPRESSION: Unremarkable pelvic ultrasound. Electronically Signed   By: Tollie Eth M.D.   On: 03/30/2016 00:06    MAU COURSE Orders Placed This Encounter  Procedures  . Wet prep, genital  . US Pelvis Complete  . US Transvaginal Non-OB  . Urinalysis, Routine w reflex microscopic (not at [facility])  . Urine microscopic-add on  . CBC with Differential/Platelet  . Pregnancy, urine POC   Meds ordered this encounter  Medications  . ibuprofen (ADVIL,MOTRIN) 600 MG tablet    Sig: Take 1 tablet (600 mg total) by mouth every 6 (six) hours as needed for moderate pain.    Dispense:  30 tablet    Refill:  1    Order Specific Question:   Supervising Provider    Answer:   CONSTANT, PEGGY [[id number]]    MDM - LLQ pain and vaginal bleeding likely dysmenorrhea and abnormal menses 2/2 recent childbirth. No evidence of new pregnancy or emergent condition.   ASSESSMENT 1. LLQ pain   2. Dysmenorrhea   3. Abnormal uterine bleeding (AUB)     PLAN Discharge home in stable condition. Bleeding and abd pain Precautions Follow-up Information    Primary care provider .   Why:  As needed if symptoms worsen       [facility] EMERGENCY DEPARTMENT .   Specialty:  Emergency Medicine Why:  As needed in emergencies. Contact information: [address] [id number] mc [place] [address] [phone]           Medication List    STOP taking these medications   famotidine 10 MG chewable tablet Commonly known as:  PEPCID AC   Ferrous Fumarate-Folic Acid 324-1 MG Tabs   ondansetron 4 MG disintegrating tablet Commonly known as:  ZOFRAN ODT   promethazine 25 MG tablet Commonly known as:  PHENERGAN      TAKE these medications   ibuprofen 600 MG tablet Commonly known as:  ADVIL,MOTRIN Take 1 tablet (600 mg total) by mouth every 6 (six) hours as needed for moderate pain.   prenatal multivitamin Tabs tablet Take 1 tablet by mouth daily.  Dorathy Kinsman, CNM 03/30/2016  12:10 AM

## 2016-03-30 NOTE — Discharge Instructions (Signed)
Abnormal Uterine Bleeding °Abnormal uterine bleeding can affect women at various stages in life, including teenagers, women in their reproductive years, pregnant women, and women who have reached menopause. Several kinds of uterine bleeding are considered abnormal, including: °· Bleeding or spotting between periods.   °· Bleeding after sexual intercourse.   °· Bleeding that is heavier or more than normal.   °· Periods that last longer than usual. °· Bleeding after menopause.   °Many cases of abnormal uterine bleeding are minor and simple to treat, while others are more serious. Any type of abnormal bleeding should be evaluated by your health care provider. Treatment will depend on the cause of the bleeding. °HOME CARE INSTRUCTIONS °Monitor your condition for any changes. The following actions may help to alleviate any discomfort you are experiencing: °· Avoid the use of tampons and douches as directed by your health care provider. °· Change your pads frequently. °You should get regular pelvic exams and Pap tests. Keep all follow-up appointments for diagnostic tests as directed by your health care provider.  °SEEK MEDICAL CARE IF:  °· Your bleeding lasts more than 1 week.   °· You feel dizzy at times.   °SEEK IMMEDIATE MEDICAL CARE IF:  °· You pass out.   °· You are changing pads every 15 to 30 minutes.   °· You have abdominal pain. °· You have a fever.   °· You become sweaty or weak.   °· You are passing large blood clots from the vagina.   °· You start to feel nauseous and vomit. °MAKE SURE YOU:  °· Understand these instructions. °· Will watch your condition. °· Will get help right away if you are not doing well or get worse. °  °This information is not intended to replace advice given to you by your health care provider. Make sure you discuss any questions you have with your health care provider. °  °Document Released: 06/13/2005 Document Revised: 06/18/2013 Document Reviewed: 01/10/2013 °Elsevier Interactive  Patient Education ©2016 Elsevier Inc. ° °Pelvic Pain, Female °Female pelvic pain can be caused by many different things and start from a variety of places. Pelvic pain refers to pain that is located in the lower half of the abdomen and between your hips. The pain may occur over a short period of time (acute) or may be reoccurring (chronic). The cause of pelvic pain may be related to disorders affecting the female reproductive organs (gynecologic), but it may also be related to the bladder, kidney stones, an intestinal complication, or muscle or skeletal problems. Getting help right away for pelvic pain is important, especially if there has been severe, sharp, or a sudden onset of unusual pain. It is also important to get help right away because some types of pelvic pain can be life threatening.  °CAUSES  °Below are only some of the causes of pelvic pain. The causes of pelvic pain can be in one of several categories.  °· Gynecologic. °¨ Pelvic inflammatory disease. °¨ Sexually transmitted infection. °¨ Ovarian cyst or a twisted ovarian ligament (ovarian torsion). °¨ Uterine lining that grows outside the uterus (endometriosis). °¨ Fibroids, cysts, or tumors. °¨ Ovulation. °· Pregnancy. °¨ Pregnancy that occurs outside the uterus (ectopic pregnancy). °¨ Miscarriage. °¨ Labor. °¨ Abruption of the placenta or ruptured uterus. °· Infection. °¨ Uterine infection (endometritis). °¨ Bladder infection. °¨ Diverticulitis. °¨ Miscarriage related to a uterine infection (septic abortion). °· Bladder. °¨ Inflammation of the bladder (cystitis). °¨ Kidney stone(s). °· Gastrointestinal. °¨ Constipation. °¨ Diverticulitis. °· Neurologic. °¨ Trauma. °¨ Feeling pelvic pain because of   mental or emotional causes (psychosomatic). °· Cancers of the bowel or pelvis. °EVALUATION  °Your caregiver will want to take a careful history of your concerns. This includes recent changes in your health, a careful gynecologic history of your periods  (menses), and a sexual history. Obtaining your family history and medical history is also important. Your caregiver may suggest a pelvic exam. A pelvic exam will help identify the location and severity of the pain. It also helps in the evaluation of which organ system may be involved. In order to identify the cause of the pelvic pain and be properly treated, your caregiver may order tests. These tests may include:  °· A pregnancy test. °· Pelvic ultrasonography. °· An X-ray exam of the abdomen. °· A urinalysis or evaluation of vaginal discharge. °· Blood tests. °HOME CARE INSTRUCTIONS  °· Only take over-the-counter or prescription medicines for pain, discomfort, or fever as directed by your caregiver.   °· Rest as directed by your caregiver.   °· Eat a balanced diet.   °· Drink enough fluids to make your urine clear or pale yellow, or as directed.   °· Avoid sexual intercourse if it causes pain.   °· Apply warm or cold compresses to the lower abdomen depending on which one helps the pain.   °· Avoid stressful situations.   °· Keep a journal of your pelvic pain. Write down when it started, where the pain is located, and if there are things that seem to be associated with the pain, such as food or your menstrual cycle. °· Follow up with your caregiver as directed.   °SEEK MEDICAL CARE IF: °· Your medicine does not help your pain. °· You have abnormal vaginal discharge. °SEEK IMMEDIATE MEDICAL CARE IF:  °· You have heavy bleeding from the vagina.   °· Your pelvic pain increases.   °· You feel light-headed or faint.   °· You have chills.   °· You have pain with urination or blood in your urine.   °· You have uncontrolled diarrhea or vomiting.   °· You have a fever or persistent symptoms for more than 3 days. °· You have a fever and your symptoms suddenly get worse.   °· You are being physically or sexually abused. °  °This information is not intended to replace advice given to you by your health care provider. Make sure  you discuss any questions you have with your health care provider. °  °Document Released: 05/10/2004 Document Revised: 03/04/2015 Document Reviewed: 10/03/2011 °Elsevier Interactive Patient Education ©2016 Elsevier Inc. ° °

## 2016-05-23 ENCOUNTER — Encounter (HOSPITAL_COMMUNITY): Payer: Self-pay

## 2016-05-23 ENCOUNTER — Inpatient Hospital Stay (HOSPITAL_COMMUNITY)
Admission: AD | Admit: 2016-05-23 | Discharge: 2016-05-23 | Disposition: A | Discharge status: Home / Self Care | Payer: Medicaid - Out of State | Source: Ambulatory Visit | Attending: Obstetrics and Gynecology | Admitting: Obstetrics and Gynecology

## 2016-05-23 DIAGNOSIS — Z79899 Other long term (current) drug therapy: Secondary | ICD-10-CM | POA: Diagnosis not present

## 2016-05-23 DIAGNOSIS — R1032 Left lower quadrant pain: Secondary | ICD-10-CM | POA: Insufficient documentation

## 2016-05-23 DIAGNOSIS — Z888 Allergy status to other drugs, medicaments and biological substances status: Secondary | ICD-10-CM | POA: Diagnosis not present

## 2016-05-23 DIAGNOSIS — K5901 Slow transit constipation: Secondary | ICD-10-CM

## 2016-05-23 DIAGNOSIS — K5900 Constipation, unspecified: Secondary | ICD-10-CM | POA: Insufficient documentation

## 2016-05-23 LAB — URINALYSIS, ROUTINE W REFLEX MICROSCOPIC
Bilirubin Urine: NEGATIVE
Glucose, UA: NEGATIVE mg/dL
Hgb urine dipstick: NEGATIVE
Ketones, ur: NEGATIVE mg/dL
Leukocytes, UA: NEGATIVE
Nitrite: NEGATIVE
Protein, ur: NEGATIVE mg/dL
Specific Gravity, Urine: 1.02 (ref 1.005–1.030)
pH: 6.5 (ref 5.0–8.0)

## 2016-05-23 LAB — WET PREP, GENITAL
Clue Cells Wet Prep HPF POC: NONE SEEN
Sperm: NONE SEEN
Trich, Wet Prep: NONE SEEN
Yeast Wet Prep HPF POC: NONE SEEN

## 2016-05-23 LAB — POCT PREGNANCY, URINE: Preg Test, Ur: NEGATIVE

## 2016-05-23 MED ORDER — POLYETHYLENE GLYCOL 3350 17 GM/SCOOP PO POWD: ORAL | 0 refills | Status: DC

## 2016-05-23 NOTE — MAU Provider Note (Signed)
History     CSN: [id number]  Arrival date and time: 05/23/16 1723   First Provider Initiated Contact with Patient 05/23/16 1855      Chief Complaint  Patient presents with  . Abdominal Pain   HPI Ms. Aletha HalimFarhiya Overturf is a 25 y.o. [id number] who presents to MAU today with complaint of LLQ abdominal pain. The patient states moderate pain, worse at night when laying on her stomach. She denies vaginal bleeding, discharge, UTI symptoms, fever or N/V/D today. She states some mild constipation. Last BM was yesterday. She states that she had 2 periods in October and was seen in MAU, but normal periods resumed in November. She desires pregnancy.    OB History    Gravida Para Term Preterm AB Living   2 2 2  0 0 2   SAB TAB Ectopic Multiple Live Births   0 0 0 0 2      Past Medical History:  Diagnosis Date  . Medical history non-contributory     Past Surgical History:  Procedure Laterality Date  . NO PAST SURGERIES      History reviewed. No pertinent family history.  Social History  Substance Use Topics  . Smoking status: Never Smoker  . Smokeless tobacco: Never Used  . Alcohol use No    Allergies:  Allergies  Allergen Reactions  . Beef-Derived Products Itching and Nausea And Vomiting  . Chicken Allergy Itching    Prescriptions Prior to Admission  Medication Sig Dispense Refill Last Dose  . ibuprofen (ADVIL,MOTRIN) 600 MG tablet Take 1 tablet (600 mg total) by mouth every 6 (six) hours as needed for moderate pain. 30 tablet 1   . Prenatal Vit-Fe Fumarate-FA (PRENATAL MULTIVITAMIN) TABS tablet Take 1 tablet by mouth daily.   03/28/2016 at Unknown time    Review of Systems  Constitutional: Negative for fever and malaise/fatigue.  Gastrointestinal: Positive for abdominal pain and constipation. Negative for diarrhea, nausea and vomiting.  Genitourinary: Negative for dysuria, frequency and urgency.       Neg - vaginal bleeding, discharge   Physical Exam   Blood pressure  122/58, pulse 81, temperature 98.2 F (36.8 C), resp. rate 18, height 5\' 3"  (1.6 m), weight 143 lb (64.9 kg), last menstrual period 05/13/2016, unknown if currently breastfeeding.  Physical Exam  Nursing note and vitals reviewed. Constitutional: She is oriented to person, place, and time. She appears well-developed and well-nourished. No distress.  HENT:  Head: Normocephalic and atraumatic.  Cardiovascular: Normal rate.   Respiratory: Effort normal.  GI: Soft. She exhibits no distension and no mass. There is tenderness (mild LLQ tenderness to palpation). There is no rebound and no guarding.  Neurological: She is alert and oriented to person, place, and time.  Skin: Skin is warm and dry. No erythema.  Psychiatric: She has a normal mood and affect.    Results for orders placed or performed during the hospital encounter of 05/23/16 (from the past 24 hour(s))  Pregnancy, urine POC     Status: None   Collection Time: 05/23/16  6:42 PM  Result Value Ref Range   Preg Test, Ur NEGATIVE NEGATIVE  Wet prep, genital     Status: Abnormal   Collection Time: 05/23/16  7:03 PM  Result Value Ref Range   Yeast Wet Prep HPF POC NONE SEEN NONE SEEN   Trich, Wet Prep NONE SEEN NONE SEEN   Clue Cells Wet Prep HPF POC NONE SEEN NONE SEEN   WBC, Wet Prep HPF POC FEW (  A) NONE SEEN   Sperm NONE SEEN     MAU Course  Procedures None  MDM UPT - negative UA, wet prep today  Assessment and Plan  A: Constipation  P: Discharge home Rx for Miralax given Warning signs for worsening condition discussed Increased PO hydration and fiber intake discussed Patient advised to follow-up with [facility] for routine care Patient may return to MAU as needed or if her condition were to change or worsen  Marny LowensteinJulie N Keshia Weare, PA-C  05/23/2016, 7:19 PM

## 2016-05-23 NOTE — MAU Note (Signed)
Pt presents to MAU with complaints of lower abdominal cramping for a couple of weeks. PT denies any vaginal bleeding or abnormal discharge

## 2016-05-23 NOTE — Discharge Instructions (Signed)
Constipation, Adult °Constipation is when a person: °· Poops (has a bowel movement) fewer times in a week than normal. °· Has a hard time pooping. °· Has poop that is dry, hard, or bigger than normal. ° °Follow these instructions at home: °Eating and drinking ° °· Eat foods that have a lot of fiber, such as: °? Fresh fruits and vegetables. °? Whole grains. °? Beans. °· Eat less of foods that are high in fat, low in fiber, or overly processed, such as: °? French fries. °? Hamburgers. °? Cookies. °? Candy. °? Soda. °· Drink enough fluid to keep your pee (urine) clear or pale yellow. °General instructions °· Exercise regularly or as told by your doctor. °· Go to the restroom when you feel like you need to poop. Do not hold it in. °· Take over-the-counter and prescription medicines only as told by your doctor. These include any fiber supplements. °· Do pelvic floor retraining exercises, such as: °? Doing deep breathing while relaxing your lower belly (abdomen). °? Relaxing your pelvic floor while pooping. °· Watch your condition for any changes. °· Keep all follow-up visits as told by your doctor. This is important. °Contact a doctor if: °· You have pain that gets worse. °· You have a fever. °· You have not pooped for 4 days. °· You throw up (vomit). °· You are not hungry. °· You lose weight. °· You are bleeding from the anus. °· You have thin, pencil-like poop (stool). °Get help right away if: °· You have a fever, and your symptoms suddenly get worse. °· You leak poop or have blood in your poop. °· Your belly feels hard or bigger than normal (is bloated). °· You have very bad belly pain. °· You feel dizzy or you faint. °This information is not intended to replace advice given to you by your health care provider. Make sure you discuss any questions you have with your health care provider. °Document Released: 11/30/2007 Document Revised: 01/01/2016 Document Reviewed: 12/02/2015 °Elsevier Interactive Patient Education ©  2017 Elsevier Inc. ° ° °High-Fiber Diet °Fiber, also called dietary fiber, is a type of carbohydrate found in fruits, vegetables, whole grains, and beans. A high-fiber diet can have many health benefits. Your health care provider may recommend a high-fiber diet to help: °· Prevent constipation. Fiber can make your bowel movements more regular. °· Lower your cholesterol. °· Relieve hemorrhoids, uncomplicated diverticulosis, or irritable bowel syndrome. °· Prevent overeating as part of a weight-loss plan. °· Prevent heart disease, type 2 diabetes, and certain cancers. ° °What is my plan? °The recommended daily intake of fiber includes: °· 38 grams for men under age 50. °· 30 grams for men over age 50. °· 25 grams for women under age 50. °· 21 grams for women over age 50. ° °You can get the recommended daily intake of dietary fiber by eating a variety of fruits, vegetables, grains, and beans. Your health care provider may also recommend a fiber supplement if it is not possible to get enough fiber through your diet. °What do I need to know about a high-fiber diet? °· Fiber supplements have not been widely studied for their effectiveness, so it is better to get fiber through food sources. °· Always check the fiber content on the nutrition facts label of any prepackaged food. Look for foods that contain at least 5 grams of fiber per serving. °· Ask your dietitian if you have questions about specific foods that are related to your condition, especially if those   foods are not listed in the following section. °· Increase your daily fiber consumption gradually. Increasing your intake of dietary fiber too quickly may cause bloating, cramping, or gas. °· Drink plenty of water. Water helps you to digest fiber. °What foods can I eat? °Grains °Whole-grain breads. Multigrain cereal. Oats and oatmeal. Brown rice. Barley. Bulgur wheat. Millet. Bran muffins. Popcorn. Rye wafer crackers. °Vegetables °Sweet potatoes. Spinach. Kale.  Artichokes. Cabbage. Broccoli. Green peas. Carrots. Squash. °Fruits °Berries. Pears. Apples. Oranges. Avocados. Prunes and raisins. Dried figs. °Meats and Other Protein Sources °Navy, kidney, pinto, and soy beans. Split peas. Lentils. Nuts and seeds. °Dairy °Fiber-fortified yogurt. °Beverages °Fiber-fortified soy milk. Fiber-fortified orange juice. °Other °Fiber bars. °The items listed above may not be a complete list of recommended foods or beverages. Contact your dietitian for more options. °What foods are not recommended? °Grains °White bread. Pasta made with refined flour. White rice. °Vegetables °Fried potatoes. Canned vegetables. Well-cooked vegetables. °Fruits °Fruit juice. Cooked, strained fruit. °Meats and Other Protein Sources °Fatty cuts of meat. Fried poultry or fried fish. °Dairy °Milk. Yogurt. Cream cheese. Sour cream. °Beverages °Soft drinks. °Other °Cakes and pastries. Butter and oils. °The items listed above may not be a complete list of foods and beverages to avoid. Contact your dietitian for more information. °What are some tips for including high-fiber foods in my diet? °· Eat a wide variety of high-fiber foods. °· Make sure that half of all grains consumed each day are whole grains. °· Replace breads and cereals made from refined flour or white flour with whole-grain breads and cereals. °· Replace white rice with brown rice, bulgur wheat, or millet. °· Start the day with a breakfast that is high in fiber, such as a cereal that contains at least 5 grams of fiber per serving. °· Use beans in place of meat in soups, salads, or pasta. °· Eat high-fiber snacks, such as berries, raw vegetables, nuts, or popcorn. °This information is not intended to replace advice given to you by your health care provider. Make sure you discuss any questions you have with your health care provider. °Document Released: 06/13/2005 Document Revised: 11/19/2015 Document Reviewed: 11/26/2013 °Elsevier Interactive Patient  Education © 2017 Elsevier Inc. ° °

## 2016-07-20 ENCOUNTER — Encounter (HOSPITAL_COMMUNITY): Payer: Self-pay | Admitting: *Deleted

## 2016-07-20 ENCOUNTER — Inpatient Hospital Stay (HOSPITAL_COMMUNITY)
Admission: AD | Admit: 2016-07-20 | Discharge: 2016-07-21 | Disposition: A | Discharge status: Home / Self Care | Payer: Medicaid - Out of State | Source: Ambulatory Visit | Attending: Obstetrics & Gynecology | Admitting: Obstetrics & Gynecology

## 2016-07-20 DIAGNOSIS — N898 Other specified noninflammatory disorders of vagina: Secondary | ICD-10-CM | POA: Insufficient documentation

## 2016-07-20 DIAGNOSIS — R102 Pelvic and perineal pain: Secondary | ICD-10-CM | POA: Diagnosis not present

## 2016-07-20 DIAGNOSIS — Z79899 Other long term (current) drug therapy: Secondary | ICD-10-CM | POA: Diagnosis not present

## 2016-07-20 DIAGNOSIS — Z888 Allergy status to other drugs, medicaments and biological substances status: Secondary | ICD-10-CM | POA: Insufficient documentation

## 2016-07-20 LAB — POCT PREGNANCY, URINE: Preg Test, Ur: NEGATIVE

## 2016-07-20 LAB — URINALYSIS, ROUTINE W REFLEX MICROSCOPIC
Bilirubin Urine: NEGATIVE
Glucose, UA: NEGATIVE mg/dL
Hgb urine dipstick: NEGATIVE
Ketones, ur: NEGATIVE mg/dL
Leukocytes, UA: NEGATIVE
Nitrite: NEGATIVE
Protein, ur: NEGATIVE mg/dL
Specific Gravity, Urine: 1.01 (ref 1.005–1.030)
pH: 7 (ref 5.0–8.0)

## 2016-07-20 MED ORDER — KETOROLAC TROMETHAMINE 60 MG/2ML IM SOLN
60.0000 mg | Freq: Once | INTRAMUSCULAR | Status: AC
  Administered 2016-07-21: 60 mg via INTRAMUSCULAR
  Filled 2016-07-20: qty 2

## 2016-07-20 NOTE — MAU Note (Signed)
Pt reports lower abd pain x one week.

## 2016-07-20 NOTE — MAU Provider Note (Signed)
History     CSN: [id number]  Arrival date and time: 07/20/16 2038   First Provider Initiated Contact with Patient 07/20/16 2346      Chief Complaint  Patient presents with  . Pelvic Pain   Pelvic Pain  The patient's primary symptoms include pelvic pain. The patient's pertinent negatives include no vaginal discharge. This is a new problem. The current episode started in the past 7 days. The problem occurs constantly. The problem has been unchanged. Pain severity now: 10/10. The problem affects both sides. She is not pregnant. Associated symptoms include abdominal pain and a fever (subjective, but did not measure with thermometer . ). Pertinent negatives include no chills, nausea or vomiting. The vaginal discharge was normal. There has been no bleeding. Nothing aggravates the symptoms. She has tried nothing for the symptoms. She uses nothing for contraception. Her menstrual history has been irregular (LMP: 07/15/16).    Past Medical History:  Diagnosis Date  . Medical history non-contributory     Past Surgical History:  Procedure Laterality Date  . NO PAST SURGERIES      No family history on file.  Social History  Substance Use Topics  . Smoking status: Never Smoker  . Smokeless tobacco: Never Used  . Alcohol use No    Allergies:  Allergies  Allergen Reactions  . Beef-Derived Products Itching and Nausea And Vomiting  . Chicken Allergy Itching    Prescriptions Prior to Admission  Medication Sig Dispense Refill Last Dose  . ibuprofen (ADVIL,MOTRIN) 600 MG tablet Take 1 tablet (600 mg total) by mouth every 6 (six) hours as needed for moderate pain. 30 tablet 1   . polyethylene glycol powder (MIRALAX) powder 1 capful daily until normal bowel movements resume 255 g 0   . Prenatal Vit-Fe Fumarate-FA (PRENATAL MULTIVITAMIN) TABS tablet Take 1 tablet by mouth daily.   03/28/2016 at Unknown time    Review of Systems  Constitutional: Positive for fever (subjective, but did not  measure with thermometer . ). Negative for chills.  Gastrointestinal: Positive for abdominal pain. Negative for nausea and vomiting.  Genitourinary: Positive for pelvic pain. Negative for vaginal bleeding and vaginal discharge.    Physical Exam   Blood pressure 122/86, pulse 67, temperature 98.4 F (36.9 C), temperature source Oral, resp. rate 16, height 5\' 3"  (1.6 m), weight 143 lb (64.9 kg), last menstrual period 07/15/2016, SpO2 100 %, unknown if currently breastfeeding.  Physical Exam  Nursing note and vitals reviewed. Constitutional: She is oriented to person, place, and time. She appears well-developed and well-nourished. No distress.  HENT:  Head: Normocephalic.  Cardiovascular: Normal rate.   Respiratory: Effort normal.  GI: Soft. There is no tenderness. There is no rebound.  Genitourinary:  Genitourinary Comments:  External: no lesion Vagina: small amount of white discharge Cervix: pink, smooth, no CMT Uterus: NSSC Adnexa: NT   Neurological: She is alert and oriented to person, place, and time.  Skin: Skin is warm and dry.  Psychiatric: She has a normal mood and affect.   Results for orders placed or performed during the hospital encounter of 07/20/16 (from the past 24 hour(s))  Urinalysis, Routine w reflex microscopic     Status: None   Collection Time: 07/20/16 10:22 PM  Result Value Ref Range   Color, Urine YELLOW YELLOW   APPearance CLEAR CLEAR   Specific Gravity, Urine 1.010 1.005 - 1.030   pH 7.0 5.0 - 8.0   Glucose, UA NEGATIVE NEGATIVE mg/dL   Hgb urine dipstick  NEGATIVE NEGATIVE   Bilirubin Urine NEGATIVE NEGATIVE   Ketones, ur NEGATIVE NEGATIVE mg/dL   Protein, ur NEGATIVE NEGATIVE mg/dL   Nitrite NEGATIVE NEGATIVE   Leukocytes, UA NEGATIVE NEGATIVE  Pregnancy, urine POC     Status: None   Collection Time: 07/20/16 10:28 PM  Result Value Ref Range   Preg Test, Ur NEGATIVE NEGATIVE  Wet prep, genital     Status: Abnormal   Collection Time: 07/20/16  11:58 PM  Result Value Ref Range   Yeast Wet Prep HPF POC NONE SEEN NONE SEEN   Trich, Wet Prep NONE SEEN NONE SEEN   Clue Cells Wet Prep HPF POC NONE SEEN NONE SEEN   WBC, Wet Prep HPF POC FEW (A) NONE SEEN   Sperm NONE SEEN   CBC     Status: Abnormal   Collection Time: 07/21/16 12:18 AM  Result Value Ref Range   WBC 7.2 4.0 - 10.5 K/uL   RBC 5.48 (H) 3.87 - 5.11 MIL/uL   Hemoglobin 10.9 (L) 12.0 - 15.0 g/dL   HCT 16.133.4 (L) 09.636.0 - 04.546.0 %   MCV 60.9 (L) 78.0 - 100.0 fL   MCH 19.9 (L) 26.0 - 34.0 pg   MCHC 32.6 30.0 - 36.0 g/dL   RDW 40.915.2 81.111.5 - 91.415.5 %   Platelets 278 150 - 400 K/uL  Comprehensive metabolic panel     Status: Abnormal   Collection Time: 07/21/16 12:18 AM  Result Value Ref Range   Sodium 140 135 - 145 mmol/L   Potassium 3.2 (L) 3.5 - 5.1 mmol/L   Chloride 107 101 - 111 mmol/L   CO2 23 22 - 32 mmol/L   Glucose, Bld 91 65 - 99 mg/dL   BUN 9 6 - 20 mg/dL   Creatinine, Ser 7.820.40 (L) 0.44 - 1.00 mg/dL   Calcium 8.8 (L) 8.9 - 10.3 mg/dL   Total Protein 7.5 6.5 - 8.1 g/dL   Albumin 4.1 3.5 - 5.0 g/dL   AST 25 15 - 41 U/L   ALT 39 14 - 54 U/L   Alkaline Phosphatase 121 38 - 126 U/L   Total Bilirubin 0.4 0.3 - 1.2 mg/dL   GFR calc non Af Amer >60 >60 mL/min   GFR calc Af Amer >60 >60 mL/min   Anion gap 10 5 - 15    MAU Course  Procedures  MDM 0103: Patient has had toradol, and she reports that her pain is better at this time.   Assessment and Plan   1. Pelvic pain in female    DC home Comfort measures reviewed  RX: none  Return to MAU as needed FU with OB as planned  Follow-up Information    [facility]   Specialty:  Obstetrics and Gynecology Why:  They will call you with an appointment  Contact information: [address] [place] [id number] [phone]           Tawnya CrookHogan, Suhaan Perleberg Donovan 07/20/2016, 11:50 PM

## 2016-07-21 DIAGNOSIS — R102 Pelvic and perineal pain: Secondary | ICD-10-CM

## 2016-07-21 LAB — GC/CHLAMYDIA PROBE AMP (~~LOC~~) NOT AT ARMC
Chlamydia: NEGATIVE
Neisseria Gonorrhea: NEGATIVE

## 2016-07-21 LAB — COMPREHENSIVE METABOLIC PANEL
ALT: 39 U/L (ref 14–54)
AST: 25 U/L (ref 15–41)
Albumin: 4.1 g/dL (ref 3.5–5.0)
Alkaline Phosphatase: 121 U/L (ref 38–126)
Anion gap: 10 (ref 5–15)
BUN: 9 mg/dL (ref 6–20)
CO2: 23 mmol/L (ref 22–32)
Calcium: 8.8 mg/dL — ABNORMAL LOW (ref 8.9–10.3)
Chloride: 107 mmol/L (ref 101–111)
Creatinine, Ser: 0.4 mg/dL — ABNORMAL LOW (ref 0.44–1.00)
GFR calc Af Amer: >60 mL/min (ref >60)
GFR calc non Af Amer: >60 mL/min (ref >60)
Glucose, Bld: 91 mg/dL (ref 65–99)
Potassium: 3.2 mmol/L — ABNORMAL LOW (ref 3.5–5.1)
Sodium: 140 mmol/L (ref 135–145)
Total Bilirubin: 0.4 mg/dL (ref 0.3–1.2)
Total Protein: 7.5 g/dL (ref 6.5–8.1)

## 2016-07-21 LAB — WET PREP, GENITAL
Clue Cells Wet Prep HPF POC: NONE SEEN
Sperm: NONE SEEN
Trich, Wet Prep: NONE SEEN
Yeast Wet Prep HPF POC: NONE SEEN

## 2016-07-21 LAB — CBC
HCT: 33.4 % — ABNORMAL LOW (ref 36.0–46.0)
Hemoglobin: 10.9 g/dL — ABNORMAL LOW (ref 12.0–15.0)
MCH: 19.9 pg — ABNORMAL LOW (ref 26.0–34.0)
MCHC: 32.6 g/dL (ref 30.0–36.0)
MCV: 60.9 fL — ABNORMAL LOW (ref 78.0–100.0)
Platelets: 278 10*3/uL (ref 150–400)
RBC: 5.48 MIL/uL — ABNORMAL HIGH (ref 3.87–5.11)
RDW: 15.2 % (ref 11.5–15.5)
WBC: 7.2 10*3/uL (ref 4.0–10.5)

## 2016-07-21 NOTE — Discharge Instructions (Signed)
Pelvic Pain, Female Pelvic pain is pain felt below the belly button and between your hips. It can be caused by many different things. It is important to get help right away. This is especially true for severe, sharp, or unusual pain that comes on suddenly.  HOME CARE  Only take medicine as told by your doctor.  Rest as told by your doctor.  Eat a healthy diet, such as fruits, vegetables, and lean meats.  Drink enough fluids to keep your pee (urine) clear or pale yellow, or as told.  Avoid sex (intercourse) if it causes pain.  Apply warm or cold packs to your lower belly (abdomen). Use the type of pack that helps the pain.  Avoid situations that cause you stress.  Keep a journal to track your pain. Write down:  When the pain started.  Where it is located.  If there are things that seem to be related to the pain, such as food or your period.  Follow up with your doctor as told. GET HELP RIGHT AWAY IF:   You have heavy bleeding from the vagina.  You have more pelvic pain.  You feel lightheaded or pass out (faint).  You have chills.  You have pain when you pee or have blood in your pee.  You cannot stop having watery poop (diarrhea).  You cannot stop throwing up (vomiting).  You have a fever or lasting symptoms for more than 3 days.  You have a fever and your symptoms suddenly get worse.  You are being physically or sexually abused.  Your medicine does not help your pain.  You have fluid (discharge) coming from your vagina that is not normal. MAKE SURE YOU:  Understand these instructions.  Will watch your condition.  Will get help if you are not doing well or get worse. This information is not intended to replace advice given to you by your health care provider. Make sure you discuss any questions you have with your health care provider. Document Released: 11/30/2007 Document Revised: 07/04/2014 Document Reviewed: 04/03/2015 Elsevier Interactive Patient  Education  2017 [employer] Inc.  

## 2016-08-17 ENCOUNTER — Encounter: Payer: Medicaid - Out of State | Admitting: Obstetrics and Gynecology

## 2016-08-24 ENCOUNTER — Encounter: Payer: Medicaid - Out of State | Admitting: Obstetrics & Gynecology

## 2017-03-24 ENCOUNTER — Encounter (HOSPITAL_COMMUNITY): Payer: Self-pay | Admitting: *Deleted

## 2017-03-24 ENCOUNTER — Inpatient Hospital Stay (HOSPITAL_COMMUNITY)
Admission: AD | Admit: 2017-03-24 | Discharge: 2017-03-24 | Disposition: A | Discharge status: Home / Self Care | Payer: Medicaid - Out of State | Source: Ambulatory Visit | Attending: Obstetrics & Gynecology | Admitting: Obstetrics & Gynecology

## 2017-03-24 DIAGNOSIS — F4310 Post-traumatic stress disorder, unspecified: Secondary | ICD-10-CM | POA: Insufficient documentation

## 2017-03-24 DIAGNOSIS — B373 Candidiasis of vulva and vagina: Secondary | ICD-10-CM | POA: Insufficient documentation

## 2017-03-24 DIAGNOSIS — Z3A11 11 weeks gestation of pregnancy: Secondary | ICD-10-CM | POA: Insufficient documentation

## 2017-03-24 DIAGNOSIS — O26891 Other specified pregnancy related conditions, first trimester: Secondary | ICD-10-CM | POA: Insufficient documentation

## 2017-03-24 DIAGNOSIS — R102 Pelvic and perineal pain: Secondary | ICD-10-CM | POA: Diagnosis not present

## 2017-03-24 DIAGNOSIS — O9989 Other specified diseases and conditions complicating pregnancy, childbirth and the puerperium: Secondary | ICD-10-CM

## 2017-03-24 DIAGNOSIS — B3731 Acute candidiasis of vulva and vagina: Secondary | ICD-10-CM

## 2017-03-24 DIAGNOSIS — R1030 Lower abdominal pain, unspecified: Secondary | ICD-10-CM | POA: Diagnosis present

## 2017-03-24 DIAGNOSIS — O98811 Other maternal infectious and parasitic diseases complicating pregnancy, first trimester: Secondary | ICD-10-CM | POA: Insufficient documentation

## 2017-03-24 DIAGNOSIS — N949 Unspecified condition associated with female genital organs and menstrual cycle: Secondary | ICD-10-CM | POA: Diagnosis not present

## 2017-03-24 DIAGNOSIS — Z79899 Other long term (current) drug therapy: Secondary | ICD-10-CM | POA: Insufficient documentation

## 2017-03-24 DIAGNOSIS — O99341 Other mental disorders complicating pregnancy, first trimester: Secondary | ICD-10-CM | POA: Diagnosis not present

## 2017-03-24 HISTORY — DX: Other specified bacterial intestinal infections: A04.8

## 2017-03-24 HISTORY — DX: Respiratory tuberculosis unspecified: A15.9

## 2017-03-24 HISTORY — DX: Post-traumatic stress disorder, unspecified: F43.10

## 2017-03-24 HISTORY — DX: Anemia, unspecified: D64.9

## 2017-03-24 LAB — WET PREP, GENITAL
Clue Cells Wet Prep HPF POC: NONE SEEN
Sperm: NONE SEEN
Trich, Wet Prep: NONE SEEN

## 2017-03-24 LAB — URINALYSIS, ROUTINE W REFLEX MICROSCOPIC
Bilirubin Urine: NEGATIVE
Glucose, UA: NEGATIVE mg/dL
Hgb urine dipstick: NEGATIVE
Ketones, ur: NEGATIVE mg/dL
Leukocytes, UA: NEGATIVE
Nitrite: NEGATIVE
Protein, ur: NEGATIVE mg/dL
Specific Gravity, Urine: 1.002 — ABNORMAL LOW (ref 1.005–1.030)
pH: 7 (ref 5.0–8.0)

## 2017-03-24 LAB — POCT PREGNANCY, URINE: Preg Test, Ur: POSITIVE — AB

## 2017-03-24 MED ORDER — TERCONAZOLE 0.8 % VA CREA: 1.0000 | TOPICAL_CREAM | Freq: Every day | VAGINAL | 0 refills | Status: DC

## 2017-03-24 NOTE — MAU Provider Note (Signed)
History     CSN: [id number]  Arrival date and time: 03/24/17 8657   First Provider Initiated Contact with Patient 03/24/17 2038      Chief Complaint  Patient presents with  . Abdominal Pain   HPI [name] is a 26 y.o. G3P2002 at [redacted]w[redacted]d who presents to MAU today with complaint of lower abdominal cramping x 2 weeks. The patient was seen by MD in [place] in July and confirmed pregnancy at that time. She states LMP early June. She states pain comes and goes. It is mild. She has not taken anything for pain. She denies vaginal bleeding, discharge, N/V/D or constipation.   OB History    Gravida Para Term Preterm AB Living   0 0 2   SAB TAB Ectopic Multiple Live Births   0 0 0 0 2      Past Medical History:  Diagnosis Date  . Anemia   . H. pylori infection   . PTSD (post-traumatic stress disorder)   . Tuberculosis    CXR neg 2008    Past Surgical History:  Procedure Laterality Date  . NO PAST SURGERIES      History reviewed. No pertinent family history.  Social History  Substance Use Topics  . Smoking status: Never Smoker  . Smokeless tobacco: Never Used  . Alcohol use No    Allergies:  Allergies  Allergen Reactions  . Beef-Derived Products Itching and Nausea And Vomiting  . Chicken Allergy Itching    Prescriptions Prior to Admission  Medication Sig Dispense Refill Last Dose  . ibuprofen (ADVIL,MOTRIN) 600 MG tablet Take 1 tablet (600 mg total) by mouth every 6 (six) hours as needed for moderate pain. 30 tablet 1   . polyethylene glycol powder (MIRALAX) powder 1 capful daily until normal bowel movements resume 255 g 0   . Prenatal Vit-Fe Fumarate-FA (PRENATAL MULTIVITAMIN) TABS tablet Take 1 tablet by mouth daily.   03/28/2016 at Unknown time    Review of Systems  Constitutional: Negative for fever.  Gastrointestinal: Positive for abdominal pain. Negative for constipation, diarrhea, nausea and vomiting.  Genitourinary: Negative for dysuria,  frequency, urgency, vaginal bleeding and vaginal discharge.   Physical Exam   Blood pressure (!) 98/55, pulse 90, temperature 98.2 F (36.8 C), temperature source Oral, resp. rate 20, height  (1.6 m), weight 135 lb (61.2 kg), last menstrual period 01/02/2017, unknown if currently breastfeeding.  Physical Exam  Nursing note and vitals reviewed. Constitutional: She is oriented to person, place, and time. She appears well-developed and well-nourished. No distress.  HENT:  Head: Normocephalic and atraumatic.  Cardiovascular: Normal rate.   Respiratory: Effort normal.  GI: Soft. She exhibits no distension and no mass. There is no tenderness. There is no rebound and no guarding.  Genitourinary: Uterus is enlarged. Uterus is not tender. Cervix exhibits no motion tenderness, no discharge and no friability. Right adnexum displays no mass and no tenderness. Left adnexum displays no mass and no tenderness. No bleeding in the vagina. No vaginal discharge found.  Genitourinary Comments: Cervix: closed, thick  Neurological: She is alert and oriented to person, place, and time.  Skin: Skin is warm and dry. No erythema.  Psychiatric: She has a normal mood and affect.     Results for orders placed or performed during the hospital encounter of 03/24/17 (from the past 24 hour(s))  Urinalysis, Routine w reflex microscopic     Status: Abnormal   Collection Time: 03/24/17  8:14 PM  Result Value Ref Range   Color, Urine STRAW (A) YELLOW   APPearance CLEAR CLEAR   Specific Gravity, Urine 1.002 (L) 1.005 - 1.030   pH 7.0 5.0 - 8.0   Glucose, UA NEGATIVE NEGATIVE mg/dL   Hgb urine dipstick NEGATIVE NEGATIVE   Bilirubin Urine NEGATIVE NEGATIVE   Ketones, ur NEGATIVE NEGATIVE mg/dL   Protein, ur NEGATIVE NEGATIVE mg/dL   Nitrite NEGATIVE NEGATIVE   Leukocytes, UA NEGATIVE NEGATIVE  Pregnancy, urine POC     Status: Abnormal   Collection Time: 03/24/17  8:42 PM  Result Value Ref Range   Preg Test, Ur  POSITIVE (A) NEGATIVE  Wet prep, genital     Status: Abnormal   Collection Time: 03/24/17  8:52 PM  Result Value Ref Range   Yeast Wet Prep HPF POC PRESENT (A) NONE SEEN   Trich, Wet Prep NONE SEEN NONE SEEN   Clue Cells Wet Prep HPF POC NONE SEEN NONE SEEN   WBC, Wet Prep HPF POC FEW (A) NONE SEEN   Sperm NONE SEEN      MAU Course  Procedures None  MDM +UPT FHR - 154 bpm with doppler UA, wet prep, GC/Chlamydia today   Assessment and Plan  A:  SIUP at [redacted]w[redacted]d Round ligament pain  Yeast vulvovaginitis  P: Discharge home Rx for Terazol sent to patient's pharmacy  Tylenol PRN for pain Discussed use of abdominal binder, warm bath/shower and moderation of activity and lifting First trimester precautions discussed Patient advised to follow-up with [facility] for prenatal care if she decides to stay in the area. Contact information given.  Patient may return to MAU as needed or if her condition were to change or worsen  Vonzella Nipple, PA-C 03/24/2017, 9:23 PM

## 2017-03-24 NOTE — Discharge Instructions (Signed)
Vaginal Yeast infection, Adult Vaginal yeast infection is a condition that causes soreness, swelling, and redness (inflammation) of the vagina. It also causes vaginal discharge. This is a common condition. Some women get this infection frequently. What are the causes? This condition is caused by a change in the normal balance of the yeast (candida) and bacteria that live in the vagina. This change causes an overgrowth of yeast, which causes the inflammation. What increases the risk? This condition is more likely to develop in:  Women who take antibiotic medicines.  Women who have diabetes.  Women who take birth control pills.  Women who are pregnant.  Women who douche often.  Women who have a weak defense (immune) system.  Women who have been taking steroid medicines for a long time.  Women who frequently wear tight clothing.  What are the signs or symptoms? Symptoms of this condition include:  White, thick vaginal discharge.  Swelling, itching, redness, and irritation of the vagina. The lips of the vagina (vulva) may be affected as well.  Pain or a burning feeling while urinating.  Pain during sex.  How is this diagnosed? This condition is diagnosed with a medical history and physical exam. This will include a pelvic exam. Your health care provider will examine a sample of your vaginal discharge under a microscope. Your health care provider may send this sample for testing to confirm the diagnosis. How is this treated? This condition is treated with medicine. Medicines may be over-the-counter or prescription. You may be told to use one or more of the following:  Medicine that is taken orally.  Medicine that is applied as a cream.  Medicine that is inserted directly into the vagina (suppository).  Follow these instructions at home:  Take or apply over-the-counter and prescription medicines only as told by your health care provider.  Do not have sex until your health  care provider has approved. Tell your sex partner that you have a yeast infection. That person should go to his or her health care provider if he or she develops symptoms.  Do not wear tight clothes, such as pantyhose or tight pants.  Avoid using tampons until your health care provider approves.  Eat more yogurt. This may help to keep your yeast infection from returning.  Try taking a sitz bath to help with discomfort. This is a warm water bath that is taken while you are sitting down. The water should only come up to your hips and should cover your buttocks. Do this 3-4 times per day or as told by your health care provider.  Do not douche.  Wear breathable, cotton underwear.  If you have diabetes, keep your blood sugar levels under control. Contact a health care provider if:  You have a fever.  Your symptoms go away and then return.  Your symptoms do not get better with treatment.  Your symptoms get worse.  You have new symptoms.  You develop blisters in or around your vagina.  You have blood coming from your vagina and it is not your menstrual period.  You develop pain in your abdomen. This information is not intended to replace advice given to you by your health care provider. Make sure you discuss any questions you have with your health care provider. Document Released: 03/23/2005 Document Revised: 11/25/2015 Document Reviewed: 12/15/2014 Elsevier Interactive Patient Education  2018 [employer] Round Ligament Pain The round ligament is a cord of muscle and tissue that helps to support the uterus. It can  become a source of pain during pregnancy if it becomes stretched or twisted as the baby grows. The pain usually begins in the second trimester of pregnancy, and it can come and go until the baby is delivered. It is not a serious problem, and it does not cause harm to the baby. Round ligament pain is usually a short, sharp, and pinching pain, but it can also be a dull,  lingering, and aching pain. The pain is felt in the lower side of the abdomen or in the groin. It usually starts deep in the groin and moves up to the outside of the hip area. Pain can occur with:  A sudden change in position.  Rolling over in bed.  Coughing or sneezing.  Physical activity.  Follow these instructions at home: Watch your condition for any changes. Take these steps to help with your pain:  When the pain starts, relax. Then try: ? Sitting down. ? Flexing your knees up to your abdomen. ? Lying on your side with one pillow under your abdomen and another pillow between your legs. ? Sitting in a warm bath for 15-20 minutes or until the pain goes away.  Take over-the-counter and prescription medicines only as told by your health care provider.  Move slowly when you sit and stand.  Avoid long walks if they cause pain.  Stop or lessen your physical activities if they cause pain.  Contact a health care provider if:  Your pain does not go away with treatment.  You feel pain in your back that you did not have before.  Your medicine is not helping. Get help right away if:  You develop a fever or chills.  You develop uterine contractions.  You develop vaginal bleeding.  You develop nausea or vomiting.  You develop diarrhea.  You have pain when you urinate. This information is not intended to replace advice given to you by your health care provider. Make sure you discuss any questions you have with your health care provider. Document Released: 03/22/2008 Document Revised: 11/19/2015 Document Reviewed: 08/20/2014 Elsevier Interactive Patient Education  [employer].

## 2017-03-24 NOTE — MAU Note (Signed)
PT SAYS CRAMPS  STARTED 2 WEEKS AGO.     SHE SAW A DR IN [place] IN July - TOLD HER SHE WAS PREG - TOLD 5 WEEKS GEST.   NO PNC

## 2017-03-27 LAB — GC/CHLAMYDIA PROBE AMP (~~LOC~~) NOT AT ARMC
Chlamydia: NEGATIVE
Neisseria Gonorrhea: NEGATIVE

## 2017-04-13 ENCOUNTER — Inpatient Hospital Stay (HOSPITAL_COMMUNITY)
Admission: AD | Admit: 2017-04-13 | Discharge: 2017-04-13 | Disposition: A | Discharge status: Home / Self Care | Payer: Medicaid - Out of State | Source: Ambulatory Visit | Attending: Obstetrics and Gynecology | Admitting: Obstetrics and Gynecology

## 2017-04-13 ENCOUNTER — Encounter (HOSPITAL_COMMUNITY): Payer: Self-pay | Admitting: *Deleted

## 2017-04-13 DIAGNOSIS — O26892 Other specified pregnancy related conditions, second trimester: Secondary | ICD-10-CM | POA: Diagnosis not present

## 2017-04-13 DIAGNOSIS — O0932 Supervision of pregnancy with insufficient antenatal care, second trimester: Secondary | ICD-10-CM | POA: Insufficient documentation

## 2017-04-13 DIAGNOSIS — R102 Pelvic and perineal pain: Secondary | ICD-10-CM | POA: Diagnosis not present

## 2017-04-13 DIAGNOSIS — K5900 Constipation, unspecified: Secondary | ICD-10-CM | POA: Insufficient documentation

## 2017-04-13 DIAGNOSIS — Z3A19 19 weeks gestation of pregnancy: Secondary | ICD-10-CM | POA: Insufficient documentation

## 2017-04-13 DIAGNOSIS — R109 Unspecified abdominal pain: Secondary | ICD-10-CM | POA: Diagnosis present

## 2017-04-13 DIAGNOSIS — Z3492 Encounter for supervision of normal pregnancy, unspecified, second trimester: Secondary | ICD-10-CM

## 2017-04-13 DIAGNOSIS — O26899 Other specified pregnancy related conditions, unspecified trimester: Secondary | ICD-10-CM

## 2017-04-13 DIAGNOSIS — O99612 Diseases of the digestive system complicating pregnancy, second trimester: Secondary | ICD-10-CM

## 2017-04-13 LAB — URINALYSIS, ROUTINE W REFLEX MICROSCOPIC
Bilirubin Urine: NEGATIVE
Glucose, UA: NEGATIVE mg/dL
Hgb urine dipstick: NEGATIVE
Ketones, ur: NEGATIVE mg/dL
Nitrite: NEGATIVE
Protein, ur: NEGATIVE mg/dL
Specific Gravity, Urine: 1.006 (ref 1.005–1.030)
pH: 7 (ref 5.0–8.0)

## 2017-04-13 NOTE — MAU Provider Note (Signed)
History     CSN: [id number]  Arrival date and time: 04/13/17 2032   First Provider Initiated Contact with Patient 04/13/17 2108      Chief Complaint  Patient presents with  . Abdominal Pain   HPI Heather Stephenson is a 26 y.o. G3P2002 at [redacted]w[redacted]d who presents with abdominal pain. No prenatal care during this pregnancy. Reports bilateral mid to lower abdominal pain since yesterday. Pain is intermittent. Rates pain 8/10. Has not treated. Pain worse with movement & position changes when in bed. Last BM was 1 week ago. Endorses constipation with this pregnancy. Is not treating constipation. Denies n/v, fever/chills, dysuria, vaginal bleeding, or LOF.   OB History    Gravida Para Term Preterm AB Living   3 2 2  0 0 2   SAB TAB Ectopic Multiple Live Births   0 0 0 0 2      Past Medical History:  Diagnosis Date  . Anemia   . H. pylori infection   . PTSD (post-traumatic stress disorder)   . Tuberculosis    CXR neg 2008    Past Surgical History:  Procedure Laterality Date  . NO PAST SURGERIES      No family history on file.  Social History  Substance Use Topics  . Smoking status: Never Smoker  . Smokeless tobacco: Never Used  . Alcohol use No    Allergies:  Allergies  Allergen Reactions  . Beef-Derived Products Itching and Nausea And Vomiting  . Chicken Allergy Itching    Prescriptions Prior to Admission  Medication Sig Dispense Refill Last Dose  . terconazole (TERAZOL 3) 0.8 % vaginal cream Place 1 applicator vaginally at bedtime. 20 g 0 04/12/2017 at Unknown time  . polyethylene glycol powder (MIRALAX) powder 1 capful daily until normal bowel movements resume 255 g 0   . Prenatal Vit-Fe Fumarate-FA (PRENATAL MULTIVITAMIN) TABS tablet Take 1 tablet by mouth daily.   03/28/2016 at Unknown time    Review of Systems  Constitutional: Negative.   Gastrointestinal: Positive for abdominal pain and constipation. Negative for blood in stool, diarrhea, nausea and vomiting.   Genitourinary: Negative.   Musculoskeletal: Negative for back pain.   Physical Exam   Blood pressure 102/63, pulse 97, temperature 97.7 F (36.5 C), temperature source Oral, resp. rate 16, height 5\' 3"  (1.6 m), weight 134 lb 8 oz (61 kg), last menstrual period 11/28/2016, unknown if currently breastfeeding.  Physical Exam  Nursing note and vitals reviewed. Constitutional: She is oriented to person, place, and time. She appears well-developed and well-nourished. No distress.  HENT:  Head: Normocephalic and atraumatic.  Eyes: Conjunctivae are normal. Right eye exhibits no discharge. Left eye exhibits no discharge. No scleral icterus.  Neck: Normal range of motion.  Cardiovascular: Normal rate, regular rhythm and normal heart sounds.   No murmur heard. Respiratory: Effort normal and breath sounds normal. No respiratory distress. She has no wheezes.  GI: Soft. Bowel sounds are normal. There is no tenderness. There is no rebound and no guarding.  Genitourinary:  Genitourinary Comments: Cervix closed/thick/high  Neurological: She is alert and oriented to person, place, and time.  Skin: Skin is warm and dry. She is not diaphoretic.  Psychiatric: She has a normal mood and affect. Her behavior is normal. Judgment and thought content normal.    MAU Course  Procedures Results for orders placed or performed during the hospital encounter of 04/13/17 (from the past 24 hour(s))  Urinalysis, Routine w reflex microscopic     Status:  Abnormal   Collection Time: 04/13/17  8:50 PM  Result Value Ref Range   Color, Urine YELLOW YELLOW   APPearance CLEAR CLEAR   Specific Gravity, Urine 1.006 1.005 - 1.030   pH 7.0 5.0 - 8.0   Glucose, UA NEGATIVE NEGATIVE mg/dL   Hgb urine dipstick NEGATIVE NEGATIVE   Bilirubin Urine NEGATIVE NEGATIVE   Ketones, ur NEGATIVE NEGATIVE mg/dL   Protein, ur NEGATIVE NEGATIVE mg/dL   Nitrite NEGATIVE NEGATIVE   Leukocytes, UA SMALL (A) NEGATIVE   RBC / HPF 0-5 0 - 5  RBC/hpf   WBC, UA 0-5 0 - 5 WBC/hpf   Bacteria, UA RARE (A) NONE SEEN   Squamous Epithelial / LPF 6-30 (A) NONE SEEN   Mucus PRESENT     MDM FHT 147. Cervix closed/thick. Abdomen soft & non tender. BS x 4.  Encouraged patient to start prenatal care asap; had not called any ob/gyns prior to today. Discussed treatment of round ligament pain & constipation. Patient reassured by exam. VSS, NAD.  Assessment and Plan  A; 1. Pain of round ligament during pregnancy   2. Fetal heart tones present, second trimester   3. Constipation during pregnancy in second trimester   4. [redacted] weeks gestation of pregnancy   5. No prenatal care in current pregnancy in second trimester    P: Discharge home Maternity support belt Discussed tx of constipation including water, fiber, & colace Start prenatal care asap Discussed reasons to return to MAU  Judeth HornErin Kaitlynne Wenz 04/13/2017, 9:08 PM

## 2017-04-13 NOTE — Discharge Instructions (Signed)
Recommend pregnancy support belt/maternity support belt.  One brand is called Prenatal Cradle You may find these on Amazon.com, [employer].com, or [employer].com   Safe Medications in Pregnancy   Acne: Benzoyl Peroxide Salicylic Acid  Backache/Headache: Tylenol: 2 regular strength every 4 hours OR              2 Extra strength every 6 hours  Colds/Coughs/Allergies: Benadryl (alcohol free) 25 mg every 6 hours as needed Breath right strips Claritin Cepacol throat lozenges Chloraseptic throat spray Cold-Eeze- up to three times per day Cough drops, alcohol free Flonase (by prescription only) Guaifenesin Mucinex Robitussin DM (plain only, alcohol free) Saline nasal spray/drops Sudafed (pseudoephedrine) & Actifed ** use only after [redacted] weeks gestation and if you do not have high blood pressure Tylenol Vicks Vaporub Zinc lozenges Zyrtec   Constipation: Colace Ducolax suppositories Fleet enema Glycerin suppositories Metamucil Milk of magnesia Miralax Senokot Smooth move tea  Diarrhea: Kaopectate Imodium A-D  *NO pepto Bismol  Hemorrhoids: Anusol Anusol HC Preparation H Tucks  Indigestion: Tums Maalox Mylanta Zantac  Pepcid  Insomnia: Benadryl (alcohol free) 25mg  every 6 hours as needed Tylenol PM Unisom, no Gelcaps  Leg Cramps: Tums MagGel  Nausea/Vomiting:  Bonine Dramamine Emetrol Ginger extract Sea bands Meclizine  Nausea medication to take during pregnancy:  Unisom (doxylamine succinate 25 mg tablets) Take one tablet daily at bedtime. If symptoms are not adequately controlled, the dose can be increased to a maximum recommended dose of two tablets daily (1/2 tablet in the morning, 1/2 tablet mid-afternoon and one at bedtime). Vitamin B6 100mg  tablets. Take one tablet twice a day (up to 200 mg per day).  Skin Rashes: Aveeno products Benadryl cream or 25mg  every 6 hours as needed Calamine Lotion 1% cortisone cream  Yeast  infection: Gyne-lotrimin 7 Monistat 7  Gum/tooth pain: Anbesol  **If taking multiple medications, please check labels to avoid duplicating the same active ingredients **take medication as directed on the label ** Do not exceed 4000 mg of tylenol in 24 hours **Do not take medications that contain aspirin or ibuprofen     [facility] [facility] Providers    [facility]       Phone: [phone]  [facility] [place] Phone: [phone]  [facility] [place]  Phone: [phone]  [facility] [place] [place]  Phone: [phone]  [facility] [place]  Phone: [phone]  [facility]       Phone: [phone]  [facility]    Phone: [phone]   [facility]    Phone: [phone]  [facility]    Phone: [phone]  [facility]                                     Phone: [phone]  [facility]    Phone: [phone]  [facility]    Phone: [phone]  [facility]       Phone: [phone]   [facility]  Phone: [phone]  [facility]    Phone: [phone]  [facility]   Phone: [phone]  [facility]      Phone: [phone]  [facility]    Phone: [phone]   Constipation, Adult Constipation is when a person  has fewer bowel movements in a week than normal, has difficulty having a bowel movement, or has stools that are dry, hard, or larger than normal. Constipation may be caused by an underlying condition. It may become worse with age if a person takes certain medicines and does not take in enough fluids. Follow these instructions at  home: Eating and drinking   Eat foods that have a lot of fiber, such as fresh fruits and vegetables, whole grains, and beans.  Limit foods that are high in fat, low in fiber, or overly processed, such as french fries, hamburgers, cookies, candies, and soda.  Drink enough fluid to keep your urine clear or pale yellow. General instructions  Exercise regularly or as told by your health care provider.  Go to the restroom when you have the urge to go. Do not hold it in.  Take over-the-counter and prescription medicines only as told by your health care provider. These include any fiber supplements.  Practice pelvic floor retraining exercises, such as deep breathing while relaxing the lower abdomen and pelvic floor relaxation during bowel movements.  Watch your condition for any changes.  Keep all follow-up visits as told by your health care provider. This is important. Contact a health care provider if:  You have pain that gets worse.  You have a fever.  You do not have a bowel movement after 4 days.  You vomit.  You are not hungry.  You lose weight.  You are bleeding from the anus.  You have thin, pencil-like stools. Get help right away if:  You have a fever and your symptoms suddenly get worse.  You leak stool or have blood in your stool.  Your abdomen is bloated.  You have severe pain in your abdomen.  You feel dizzy or you faint. This information is not intended to replace advice given to you by your health care provider. Make sure you discuss any questions you have with your health care provider. Document Released: 03/11/2004 Document Revised: 01/01/2016 Document Reviewed: 12/02/2015 Elsevier Interactive Patient Education  2017 [employer].

## 2017-04-13 NOTE — MAU Note (Signed)
PT SAYS SHE HAS ABD  PAIN -  STARTED  LAST NIGHT .  PNC-   NO DR.     LAST SEX- LAST WEEK.

## 2017-05-28 ENCOUNTER — Encounter: Payer: Self-pay | Admitting: Student

## 2017-05-28 DIAGNOSIS — Z789 Other specified health status: Secondary | ICD-10-CM | POA: Insufficient documentation

## 2017-05-28 DIAGNOSIS — D563 Thalassemia minor: Secondary | ICD-10-CM

## 2017-05-28 HISTORY — DX: Thalassemia minor: D56.3

## 2017-05-30 ENCOUNTER — Encounter: Payer: Medicaid - Out of State | Admitting: Student

## 2017-11-27 ENCOUNTER — Encounter (HOSPITAL_COMMUNITY): Payer: Self-pay

## 2018-06-27 NOTE — L&D Delivery Note (Addendum)
OB/GYN Faculty Practice Delivery Note  Heather Stephenson is a 28 y.o. [id number] s/p SVD at [redacted]w[redacted]d by L/21. She was admitted for PDIOL, early labor.   ROM: 1h 47m with clear fluid GBS Status: negative Maximum Maternal Temperature: Temp (24hrs), Avg:98.5 F (36.9 C), Min:98.2 F (36.8 C), Max:98.7 F (37.1 C)  Labor Progress: . Admitted in early labor  . Augmentation with pitocin  . SROM, total labor <12 hours  Delivery Date/Time: 11/26/18 at 1437 Delivery: Called to room and patient was complete and pushing. Head delivered OA. No nuchal cord present. Shoulder and body delivered in usual fashion. Infant with spontaneous cry, placed on mother's abdomen, dried and stimulated. Cord clamped x 2 after 1-minute delay, and cut by FOB. Cord blood drawn. Placenta delivered spontaneously with gentle cord traction. Fundus firm with massage and Pitocin. Labia, perineum, vagina, and cervix inspected and found two 1st degree (perineum & at 11 o'clock) lacerations with good approximation and no sutures required. Given IM methergine x 1 for continued oozing after delivery.  Placenta: intact Complications: none Lacerations: none EBL: 609cc Analgesia: none  Postpartum Planning [x]  vaccines UTD  Infant: Viable female  APGARs 8/9  3291g  Genia Hotter, M.D.  Family Medicine  PGY-1 11/26/2018 6:06 PM  Attestation: I was present for the entire delivery and assisted the resident.   Cristal Deer. Earlene Plater, DO OB/GYN Fellow

## 2018-07-05 LAB — OB RESULTS CONSOLE HIV ANTIBODY (ROUTINE TESTING): HIV: NONREACTIVE

## 2018-07-05 LAB — OB RESULTS CONSOLE ABO/RH: RH Type: POSITIVE

## 2018-07-05 LAB — OB RESULTS CONSOLE RPR: RPR: NONREACTIVE

## 2018-07-05 LAB — OB RESULTS CONSOLE ANTIBODY SCREEN: Antibody Screen: NEGATIVE

## 2018-07-05 LAB — OB RESULTS CONSOLE HEPATITIS B SURFACE ANTIGEN: Hepatitis B Surface Ag: NEGATIVE

## 2018-07-26 LAB — OB RESULTS CONSOLE GC/CHLAMYDIA
Chlamydia: NEGATIVE
Gonorrhea: NEGATIVE

## 2018-09-23 ENCOUNTER — Inpatient Hospital Stay (HOSPITAL_COMMUNITY)
Admission: AD | Admit: 2018-09-23 | Discharge: 2018-09-23 | Disposition: A | Discharge status: Home / Self Care | Payer: Medicaid - Out of State | Attending: Obstetrics and Gynecology | Admitting: Obstetrics and Gynecology

## 2018-09-23 ENCOUNTER — Other Ambulatory Visit: Payer: Self-pay

## 2018-09-23 ENCOUNTER — Encounter (HOSPITAL_COMMUNITY): Payer: Self-pay

## 2018-09-23 DIAGNOSIS — D563 Thalassemia minor: Secondary | ICD-10-CM

## 2018-09-23 DIAGNOSIS — R42 Dizziness and giddiness: Secondary | ICD-10-CM

## 2018-09-23 DIAGNOSIS — O4703 False labor before 37 completed weeks of gestation, third trimester: Secondary | ICD-10-CM | POA: Insufficient documentation

## 2018-09-23 DIAGNOSIS — D561 Beta thalassemia: Secondary | ICD-10-CM | POA: Insufficient documentation

## 2018-09-23 DIAGNOSIS — O26893 Other specified pregnancy related conditions, third trimester: Secondary | ICD-10-CM

## 2018-09-23 DIAGNOSIS — Z3A31 31 weeks gestation of pregnancy: Secondary | ICD-10-CM | POA: Insufficient documentation

## 2018-09-23 LAB — CBC
HCT: 26.9 % — ABNORMAL LOW (ref 36.0–46.0)
Hemoglobin: 8.5 g/dL — ABNORMAL LOW (ref 12.0–15.0)
MCH: 20.7 pg — ABNORMAL LOW (ref 26.0–34.0)
MCHC: 31.6 g/dL (ref 30.0–36.0)
MCV: 65.6 fL — ABNORMAL LOW (ref 80.0–100.0)
Platelets: 217 10*3/uL (ref 150–400)
RBC: 4.1 MIL/uL (ref 3.87–5.11)
RDW: 14.6 % (ref 11.5–15.5)
WBC: 8.6 10*3/uL (ref 4.0–10.5)
nRBC: 0.3 % — ABNORMAL HIGH (ref 0.0–0.2)

## 2018-09-23 LAB — URINALYSIS, ROUTINE W REFLEX MICROSCOPIC
Bilirubin Urine: NEGATIVE
Glucose, UA: NEGATIVE mg/dL
Hgb urine dipstick: NEGATIVE
Ketones, ur: NEGATIVE mg/dL
Nitrite: NEGATIVE
Protein, ur: NEGATIVE mg/dL
Specific Gravity, Urine: 1.014 (ref 1.005–1.030)
pH: 7 (ref 5.0–8.0)

## 2018-09-23 LAB — FETAL FIBRONECTIN: Fetal Fibronectin: NEGATIVE

## 2018-09-23 MED ORDER — FERROUS SULFATE 325 (65 FE) MG PO TABS: 325.0000 mg | ORAL_TABLET | Freq: Two times a day (BID) | ORAL | 3 refills | Status: AC

## 2018-09-23 NOTE — Discharge Instructions (Signed)
Dizziness °Dizziness is a common problem. It is a feeling of unsteadiness or light-headedness. You may feel like you are about to faint. Dizziness can lead to injury if you stumble or fall. Anyone can become dizzy, but dizziness is more common in older adults. This condition can be caused by a number of things, including medicines, dehydration, or illness. °Follow these instructions at home: °Eating and drinking °· Drink enough fluid to keep your urine clear or pale yellow. This helps to keep you from becoming dehydrated. Try to drink more clear fluids, such as water. °· Do not drink alcohol. °· Limit your caffeine intake if told to do so by your health care provider. Check ingredients and nutrition facts to see if a food or beverage contains caffeine. °· Limit your salt (sodium) intake if told to do so by your health care provider. Check ingredients and nutrition facts to see if a food or beverage contains sodium. °Activity °· Avoid making quick movements. °? Rise slowly from chairs and steady yourself until you feel okay. °? In the morning, first sit up on the side of the bed. When you feel okay, stand slowly while you hold onto something until you know that your balance is fine. °· If you need to stand in one place for a long time, move your legs often. Tighten and relax the muscles in your legs while you are standing. °· Do not drive or use heavy machinery if you feel dizzy. °· Avoid bending down if you feel dizzy. Place items in your home so that they are easy for you to reach without leaning over. °Lifestyle °· Do not use any products that contain nicotine or tobacco, such as cigarettes and e-cigarettes. If you need help quitting, ask your health care provider. °· Try to reduce your stress level by using methods such as yoga or meditation. Talk with your health care provider if you need help to manage your stress. °General instructions °· Watch your dizziness for any changes. °· Take over-the-counter and  prescription medicines only as told by your health care provider. Talk with your health care provider if you think that your dizziness is caused by a medicine that you are taking. °· Tell a friend or a family member that you are feeling dizzy. If he or she notices any changes in your behavior, have this person call your health care provider. °· Keep all follow-up visits as told by your health care provider. This is important. °Contact a health care provider if: °· Your dizziness does not go away. °· Your dizziness or light-headedness gets worse. °· You feel nauseous. °· You have reduced hearing. °· You have new symptoms. °· You are unsteady on your feet or you feel like the room is spinning. °Get help right away if: °· You vomit or have diarrhea and are unable to eat or drink anything. °· You have problems talking, walking, swallowing, or using your arms, hands, or legs. °· You feel generally weak. °· You are not thinking clearly or you have trouble forming sentences. It may take a friend or family member to notice this. °· You have chest pain, abdominal pain, shortness of breath, or sweating. °· Your vision changes. °· You have any bleeding. °· You have a severe headache. °· You have neck pain or a stiff neck. °· You have a fever. °These symptoms may represent a serious problem that is an emergency. Do not wait to see if the symptoms will go away. Get medical help   right away. Call your local emergency services (911 in the [place] Do not drive yourself to the hospital. Summary  Dizziness is a feeling of unsteadiness or light-headedness. This condition can be caused by a number of things, including medicines, dehydration, or illness.  Anyone can become dizzy, but dizziness is more common in older adults.  Drink enough fluid to keep your urine clear or pale yellow. Do not drink alcohol.  Avoid making quick movements if you feel dizzy. Monitor your dizziness for any changes. This information is not intended to  replace advice given to you by your health care provider. Make sure you discuss any questions you have with your health care provider. Document Released: 12/07/2000 Document Revised: 07/16/2016 Document Reviewed: 07/16/2016 Elsevier Interactive Patient Education  2019 Elsevier [employer].  [facility] Prenatal Care Providers   Center for [employer] [facility] at [facility]       Phone: [phone]  Center for [facility] at [place] Phone: [phone]  Center for [employer] at [place]  Phone: [phone]  Center for [facility] at [facility]  Phone: [phone]  Center for [facility] at [place]  Phone: [phone]  [facility]       Phone: [phone]  [facility]    Phone: [phone]   [facility])    Phone: [phone]  [facility]    Phone: [phone]  [facility]    Phone: [phone]   [facility]  Phone: [phone]  [facility]    Phone: [phone]  [facility]   Phone: [phone]  [facility]    Phone: [phone]

## 2018-09-23 NOTE — MAU Note (Signed)
Pt here with c/o lower abdominal pain and dizziness. She states she moved her last month from [facility] and has not had [facility] here. EDC is "late May." Denies any bleeding or leaking. Baby moving well.

## 2018-09-23 NOTE — MAU Provider Note (Addendum)
CC:  Chief Complaint  Patient presents with  . Abdominal Pain  . Dizziness     First Provider Initiated Contact with Patient 09/23/18 2003      HPI: Heather Stephenson is a 28 y.o. year old G34P2002 female at [redacted]w[redacted]d weeks gestation who presents to MAU reporting low abd/pubic bone pain x 1 week and dizziness x 1 month, Dx's w/ Beta thalassemia. Had prenatal care in [place] and has moved back to [place] to be with her husband and plans to deliver here. Has contacted [facility].   Associated Sx:  Vaginal bleeding: Denies Leaking of fluid: Denies Fetal movement: Normal  O:  Patient Vitals for the past 24 hrs:  BP Temp Temp src Pulse Resp SpO2 Height Weight  09/23/18 2015 107/64 - - (!) 101 - - - -  09/23/18 2008 (!) 94/57 - - 94 - - - -  09/23/18 2005 (!) 99/55 - - 90 - - - -  09/23/18 1959 104/64 - - 96 - 99 % - -  09/23/18 1926 (!) 101/57 98.1 F (36.7 C) Oral (!) 101 18 99 % 5\' 3"  (1.6 m) 64.4 kg    General: NAD Heart: Regular rate Lungs: Normal rate and effort Abd: Soft, NT, Gravid, S=D Pelvic: NEFG, negative pooling, negative blood.  Dilation: Closed Effacement (%): Thick Exam by:: Dorathy Kinsman CNM  EFM: 140, Moderate variability, 15 x 15 accelerations, no decelerations Toco: None  Results for orders placed or performed during the hospital encounter of 09/23/18 (from the past 24 hour(s))  Fetal fibronectin     Status: None   Collection Time: 09/23/18  8:04 PM  Result Value Ref Range   Fetal Fibronectin NEGATIVE NEGATIVE  Urinalysis, Routine w reflex microscopic     Status: Abnormal   Collection Time: 09/23/18  8:05 PM  Result Value Ref Range   Color, Urine YELLOW YELLOW   APPearance HAZY (A) CLEAR   Specific Gravity, Urine 1.014 1.005 - 1.030   pH 7.0 5.0 - 8.0   Glucose, UA NEGATIVE NEGATIVE mg/dL   Hgb urine dipstick NEGATIVE NEGATIVE   Bilirubin Urine NEGATIVE NEGATIVE   Ketones, ur NEGATIVE NEGATIVE mg/dL   Protein, ur NEGATIVE NEGATIVE mg/dL   Nitrite NEGATIVE  NEGATIVE   Leukocytes,Ua LARGE (A) NEGATIVE   RBC / HPF 0-5 0 - 5 RBC/hpf   WBC, UA 21-50 0 - 5 WBC/hpf   Bacteria, UA FEW (A) NONE SEEN   Squamous Epithelial / LPF 21-50 0 - 5   Mucus PRESENT    Non Squamous Epithelial 0-5 (A) NONE SEEN  CBC     Status: Abnormal   Collection Time: 09/23/18  9:14 PM  Result Value Ref Range   WBC 8.6 4.0 - 10.5 K/uL   RBC 4.10 3.87 - 5.11 MIL/uL   Hemoglobin 8.5 (L) 12.0 - 15.0 g/dL   HCT 56.3 (L) 14.9 - 70.2 %   MCV 65.6 (L) 80.0 - 100.0 fL   MCH 20.7 (L) 26.0 - 34.0 pg   MCHC 31.6 30.0 - 36.0 g/dL   RDW 63.7 85.8 - 85.0 %   Platelets 217 150 - 400 K/uL   nRBC 0.3 (H) 0.0 - 0.2 %    Orders Placed This Encounter  Procedures  . Urinalysis, Routine w reflex microscopic  . Fetal fibronectin  . Orthostatic vital signs   Care of patient turned over to [facility], CNM at 9:00.   A: [redacted]w[redacted]d week IUP Braxton Hicks versus symphysis pubis pain.  No evidence of  active preterm labor.  Fibronectin negative. Anemia pregnancy.  Orthostatic vital signs normal. FHR reactive  [name], [name] 09/23/2018 9:17 PM  Follow Up  -Report received and care assumed of patient. -Results return as above. -Will send urine for culture. -In room to discuss results. -Will start Fe Sulfate 325 BID Disp 60, RF 3 sent to pharmacy on file.  -Encouraged to restart/transfer [facility] now. -Provided list of providers for transfer of care. -No questions or concerns. -Encouraged to call or return to MAU if symptoms worsen or with the onset of new symptoms. -Discharged to home in stable condition  Cherre Robins MSN, CNM 09/23/2018 10:55 PM

## 2018-09-25 LAB — CULTURE, OB URINE

## 2018-10-01 ENCOUNTER — Telehealth: Payer: Self-pay | Admitting: Family Medicine

## 2018-10-01 NOTE — Telephone Encounter (Signed)
The patient's husband called to schedule the patient an appointment. Explained to the patient we do not have the medical records as of yet from the previous provider. Offered the patient our fax number if they talk to the doctor perhaps they can send the records over. Explained to the patient the [id number] is creating an delay with all offices. No changes made.

## 2018-11-26 ENCOUNTER — Encounter (HOSPITAL_COMMUNITY): Payer: Self-pay

## 2018-11-26 ENCOUNTER — Other Ambulatory Visit: Payer: Self-pay

## 2018-11-26 ENCOUNTER — Inpatient Hospital Stay (HOSPITAL_COMMUNITY)
Admission: AD | Admit: 2018-11-26 | Discharge: 2018-11-28 | DRG: 807 | Disposition: A | Discharge status: Home / Self Care | Payer: Medicaid - Out of State | Attending: Obstetrics & Gynecology | Admitting: Obstetrics & Gynecology

## 2018-11-26 DIAGNOSIS — D649 Anemia, unspecified: Secondary | ICD-10-CM | POA: Diagnosis present

## 2018-11-26 DIAGNOSIS — Z1159 Encounter for screening for other viral diseases: Secondary | ICD-10-CM

## 2018-11-26 DIAGNOSIS — Z3A41 41 weeks gestation of pregnancy: Secondary | ICD-10-CM | POA: Diagnosis not present

## 2018-11-26 DIAGNOSIS — O9902 Anemia complicating childbirth: Secondary | ICD-10-CM | POA: Diagnosis present

## 2018-11-26 DIAGNOSIS — O480 Post-term pregnancy: Principal | ICD-10-CM | POA: Diagnosis present

## 2018-11-26 LAB — ABO/RH: ABO/RH(D): O POS

## 2018-11-26 LAB — SARS CORONAVIRUS 2 BY RT PCR (HOSPITAL ORDER, PERFORMED IN ~~LOC~~ HOSPITAL LAB): SARS Coronavirus 2: NEGATIVE

## 2018-11-26 LAB — TYPE AND SCREEN
ABO/RH(D): O POS
Antibody Screen: NEGATIVE

## 2018-11-26 LAB — CBC
HCT: 28 % — ABNORMAL LOW (ref 36.0–46.0)
Hemoglobin: 8 g/dL — ABNORMAL LOW (ref 12.0–15.0)
MCH: 17.7 pg — ABNORMAL LOW (ref 26.0–34.0)
MCHC: 28.6 g/dL — ABNORMAL LOW (ref 30.0–36.0)
MCV: 61.9 fL — ABNORMAL LOW (ref 80.0–100.0)
Platelets: 222 10*3/uL (ref 150–400)
RBC: 4.52 MIL/uL (ref 3.87–5.11)
RDW: 17.5 % — ABNORMAL HIGH (ref 11.5–15.5)
WBC: 7.3 10*3/uL (ref 4.0–10.5)
nRBC: 0.8 % — ABNORMAL HIGH (ref 0.0–0.2)

## 2018-11-26 LAB — OB RESULTS CONSOLE RUBELLA ANTIBODY, IGM: Rubella: IMMUNE

## 2018-11-26 LAB — RPR: RPR Ser Ql: NONREACTIVE

## 2018-11-26 LAB — GROUP B STREP BY PCR: Group B strep by PCR: NEGATIVE

## 2018-11-26 MED ORDER — IBUPROFEN 600 MG PO TABS
600.0000 mg | ORAL_TABLET | Freq: Four times a day (QID) | ORAL | Status: DC
  Administered 2018-11-26 – 2018-11-28 (×7): 600 mg via ORAL
  Filled 2018-11-26 (×7): qty 1

## 2018-11-26 MED ORDER — ACETAMINOPHEN 325 MG PO TABS: 650.0000 mg | ORAL_TABLET | ORAL | Status: DC | PRN

## 2018-11-26 MED ORDER — PRENATAL MULTIVITAMIN CH
1.0000 | ORAL_TABLET | Freq: Every day | ORAL | Status: DC
  Administered 2018-11-27: 1 via ORAL
  Filled 2018-11-26: qty 1

## 2018-11-26 MED ORDER — FERROUS SULFATE 325 (65 FE) MG PO TABS
325.0000 mg | ORAL_TABLET | Freq: Two times a day (BID) | ORAL | Status: DC
  Administered 2018-11-27 – 2018-11-28 (×3): 325 mg via ORAL
  Filled 2018-11-26 (×3): qty 1

## 2018-11-26 MED ORDER — OXYCODONE HCL 5 MG PO TABS: 5.0000 mg | ORAL_TABLET | ORAL | Status: DC | PRN

## 2018-11-26 MED ORDER — OXYTOCIN BOLUS FROM INFUSION
500.0000 mL | Freq: Once | INTRAVENOUS | Status: AC
  Administered 2018-11-26: 15:00:00 500 mL via INTRAVENOUS

## 2018-11-26 MED ORDER — LACTATED RINGERS IV SOLN
INTRAVENOUS | Status: DC
  Administered 2018-11-26: 12:00:00 via INTRAVENOUS

## 2018-11-26 MED ORDER — LACTATED RINGERS IV SOLN: 500.0000 mL | INTRAVENOUS | Status: DC | PRN

## 2018-11-26 MED ORDER — DIBUCAINE (PERIANAL) 1 % EX OINT: 1.0000 "application " | TOPICAL_OINTMENT | CUTANEOUS | Status: DC | PRN

## 2018-11-26 MED ORDER — LIDOCAINE HCL (PF) 1 % IJ SOLN: 30.0000 mL | INTRAMUSCULAR | Status: DC | PRN

## 2018-11-26 MED ORDER — DIPHENHYDRAMINE HCL 25 MG PO CAPS: 25.0000 mg | ORAL_CAPSULE | Freq: Four times a day (QID) | ORAL | Status: DC | PRN

## 2018-11-26 MED ORDER — WITCH HAZEL-GLYCERIN EX PADS: 1.0000 "application " | MEDICATED_PAD | CUTANEOUS | Status: DC | PRN

## 2018-11-26 MED ORDER — SIMETHICONE 80 MG PO CHEW: 80.0000 mg | CHEWABLE_TABLET | ORAL | Status: DC | PRN

## 2018-11-26 MED ORDER — OXYCODONE-ACETAMINOPHEN 5-325 MG PO TABS: 2.0000 | ORAL_TABLET | ORAL | Status: DC | PRN

## 2018-11-26 MED ORDER — ONDANSETRON HCL 4 MG PO TABS: 4.0000 mg | ORAL_TABLET | ORAL | Status: DC | PRN

## 2018-11-26 MED ORDER — OXYCODONE-ACETAMINOPHEN 5-325 MG PO TABS: 1.0000 | ORAL_TABLET | ORAL | Status: DC | PRN

## 2018-11-26 MED ORDER — ONDANSETRON HCL 4 MG/2ML IJ SOLN: 4.0000 mg | INTRAMUSCULAR | Status: DC | PRN

## 2018-11-26 MED ORDER — FENTANYL CITRATE (PF) 100 MCG/2ML IJ SOLN
INTRAMUSCULAR | Status: AC
  Filled 2018-11-26: qty 2

## 2018-11-26 MED ORDER — SENNOSIDES-DOCUSATE SODIUM 8.6-50 MG PO TABS
2.0000 | ORAL_TABLET | ORAL | Status: DC
  Administered 2018-11-26 – 2018-11-27 (×2): 2 via ORAL
  Filled 2018-11-26 (×2): qty 2

## 2018-11-26 MED ORDER — OXYTOCIN 40 UNITS IN NORMAL SALINE INFUSION - SIMPLE MED: 2.5000 [IU]/h | INTRAVENOUS | Status: DC

## 2018-11-26 MED ORDER — OXYTOCIN 40 UNITS IN NORMAL SALINE INFUSION - SIMPLE MED
1.0000 m[IU]/min | INTRAVENOUS | Status: DC
  Administered 2018-11-26: 11:00:00 2 m[IU]/min via INTRAVENOUS
  Filled 2018-11-26: qty 1000

## 2018-11-26 MED ORDER — ONDANSETRON HCL 4 MG/2ML IJ SOLN: 4.0000 mg | Freq: Four times a day (QID) | INTRAMUSCULAR | Status: DC | PRN

## 2018-11-26 MED ORDER — METHYLERGONOVINE MALEATE 0.2 MG/ML IJ SOLN
INTRAMUSCULAR | Status: AC
  Filled 2018-11-26: qty 1

## 2018-11-26 MED ORDER — FENTANYL CITRATE (PF) 100 MCG/2ML IJ SOLN
100.0000 ug | INTRAMUSCULAR | Status: DC | PRN
  Administered 2018-11-26: 100 ug via INTRAVENOUS

## 2018-11-26 MED ORDER — SOD CITRATE-CITRIC ACID 500-334 MG/5ML PO SOLN: 30.0000 mL | ORAL | Status: DC | PRN

## 2018-11-26 MED ORDER — METHYLERGONOVINE MALEATE 0.2 MG/ML IJ SOLN
0.2000 mg | Freq: Once | INTRAMUSCULAR | Status: AC
  Administered 2018-11-26: 15:00:00 0.2 mg via INTRAMUSCULAR

## 2018-11-26 MED ORDER — COCONUT OIL OIL: 1.0000 "application " | TOPICAL_OIL | Status: DC | PRN

## 2018-11-26 MED ORDER — BENZOCAINE-MENTHOL 20-0.5 % EX AERO: 1.0000 "application " | INHALATION_SPRAY | CUTANEOUS | Status: DC | PRN

## 2018-11-26 MED ORDER — TERBUTALINE SULFATE 1 MG/ML IJ SOLN: 0.2500 mg | Freq: Once | INTRAMUSCULAR | Status: DC | PRN

## 2018-11-26 MED ORDER — TETANUS-DIPHTH-ACELL PERTUSSIS 5-2.5-18.5 LF-MCG/0.5 IM SUSP: 0.5000 mL | Freq: Once | INTRAMUSCULAR | Status: DC

## 2018-11-26 NOTE — Discharge Summary (Signed)
Obstetrics Discharge Summary OB/GYN Faculty Practice   Patient Name: Heather Stephenson DOB: 12/24/1990 MRN: [id number]  Date of admission: 11/26/2018 Delivering MD: Tamera StandsWALLACE, LAUREL S   Date of discharge: 11/28/2018  Admitting diagnosis: 41WKS CTX, BLEEDING Intrauterine pregnancy: 7761w0d     Secondary diagnosis:   Active Problems:   Normal labor    Discharge diagnosis: Term Pregnancy Delivered                                            Postpartum procedures: None  Complications:  Given IM methergine x 1 for continued oozing after delivery.  Outpatient Follow-Up: [x ] desires outpatient Nexplanon - [facility] as [facility]  Hospital course: Heather Stephenson is a 28 y.o. 8161w0d who was admitted for post-dates augmentation of early labor. Her pregnancy was complicated by short interval pregnancy, sporadic prenatal care (moved from [place]). Her labor course was notable for admission in early labor, augmentation with pitocin, SROM clear fluid, progression to complete. Delivery was complicated by 1st degree perineal laceration (hemostatic), some fetal bradycardia (90-100s) with pushing. Please see delivery/op note for additional details. Her postpartum course was uncomplicated. She was bottle feeding without difficulty. By day of discharge, she was passing flatus, urinating, eating and drinking without difficulty. Her pain was well-controlled, and she was discharged home with ibuprofen and Iron. She will follow-up in clinic in 4 weeks.   Physical exam  Vitals:   11/27/18 1045 11/27/18 1515 11/27/18 2334 11/28/18 0522  BP: 112/73 109/61 117/76 (!) 105/59  Pulse: 72 76 88 76  Resp: 16 16 18 18   Temp: 98 F (36.7 C) 98.1 F (36.7 C) 98.1 F (36.7 C) 99.1 F (37.3 C)  TempSrc: Oral Oral Oral Oral  SpO2:      Weight:      Height:       General: Alert  Lochia: appropriate Uterine Fundus: firm Incision: N/A DVT Evaluation: No evidence of DVT seen on physical exam.  Labs: Lab Results   Component Value Date   WBC 11.3 (H) 11/27/2018   HGB 6.7 (LL) 11/28/2018   HCT 22.5 (L) 11/28/2018   MCV 61.4 (L) 11/27/2018   PLT 213 11/27/2018   CMP Latest Ref Rng & Units 07/21/2016  Glucose 65 - 99 mg/dL 91  BUN 6 - 20 mg/dL 9  Creatinine 4.090.44 - 8.111.00 mg/dL 9.14(N0.40(L)  Sodium 829135 - 562145 mmol/L 140  Potassium 3.5 - 5.1 mmol/L 3.2(L)  Chloride 101 - 111 mmol/L 107  CO2 22 - 32 mmol/L 23  Calcium 8.9 - 10.3 mg/dL 1.3(Y8.8(L)  Total Protein 6.5 - 8.1 g/dL 7.5  Total Bilirubin 0.3 - 1.2 mg/dL 0.4  Alkaline Phos 38 - 126 U/L 121  AST 15 - 41 U/L 25  ALT 14 - 54 U/L 39    Discharge instructions: Per After Visit Summary and "Baby and Me Booklet"  After visit meds:  Allergies as of 11/28/2018      Reactions   Beef-derived Products Itching, Nausea And Vomiting      Medication List    TAKE these medications   ferrous sulfate 325 (65 FE) MG tablet Take 1 tablet (325 mg total) by mouth 2 (two) times daily with a meal. What changed:  Another medication with the same name was added. Make sure you understand how and when to take each.   Iron 325 (65  Fe) MG Tabs Take 1 tablet (325 mg total) by mouth 2 (two) times daily with a meal. What changed:  You were already taking a medication with the same name, and this prescription was added. Make sure you understand how and when to take each.   ibuprofen 600 MG tablet Commonly known as:  ADVIL Take 1 tablet (600 mg total) by mouth every 6 (six) hours.       Postpartum contraception: Nexplanon Diet: Routine Diet Activity: Advance as tolerated. Pelvic rest for 6 weeks.   Follow-up Appt: Future Appointments  Date Time Provider Department Center  01/01/2019  1:55 PM Rolm Bookbinder, CNM [facility]   Follow-up Visit:No follow-ups on file.  Please schedule this patient for Postpartum visit in: 4 weeks with the following provider: Any provider Low risk pregnancy complicated by: none Delivery mode:  SVD Anticipated Birth Control:   Nexplanon PP Procedures needed: none  Schedule Integrated BH visit: no  Newborn Data: Live born female  Birth Weight:  3291 g APGAR: 8 , 9  Newborn Delivery   Birth date/time:  11/26/2018 14:37:00 Delivery type:  Vaginal, Spontaneous     Baby Feeding: Bottle Disposition:home with mother   Venia Carbon I, NP 11/28/2018 10:16 AM

## 2018-11-26 NOTE — H&P (Signed)
OBSTETRIC ADMISSION HISTORY AND PHYSICAL  Karenda Dore is a 28 y.o. female [phone] with IUP at [redacted]w[redacted]d by L/21 presenting for PDIOL, early labor.   Reports fetal movement. Denies vaginal bleeding, leakage of fluids.   She received her prenatal care at Clinic(s) in [place].  Support person in labor: Husband   Ultrasounds . 21w3: anterior placenta, normal growth, completed anatomy survey  Prenatal History/Complications: . Late to Prenatal Care . Interrupted prenatal care - moving across the country  . Beta thal trait . PTSD . Language barrier - Somali . Asthma . Latent TB  Past Medical History: Past Medical History:  Diagnosis Date  . Anemia   . Asthma   . Beta thalassemia trait 05/28/2017  . H. pylori infection   . PTSD (post-traumatic stress disorder)   . Tuberculosis    CXR neg 2008    Past Surgical History: Past Surgical History:  Procedure Laterality Date  . NO PAST SURGERIES      Obstetrical History: OB History    Gravida  4   Para  3   Term  3   Preterm  0   AB  0   Living  3     SAB  0   TAB  0   Ectopic  0   Multiple  0   Live Births  3           Social History: Social History   Socioeconomic History  . Marital status: Married    Spouse name: Not on file  . Number of children: Not on file  . Years of education: Not on file  . Highest education level: Not on file  Occupational History  . Not on file  Social Needs  . Financial resource strain: Not on file  . Food insecurity:    Worry: Not on file    Inability: Not on file  . Transportation needs:    Medical: Not on file    Non-medical: Not on file  Tobacco Use  . Smoking status: Never Smoker  . Smokeless tobacco: Never Used  Substance and Sexual Activity  . Alcohol use: No  . Drug use: No  . Sexual activity: Yes    Birth control/protection: None    Comment: last IC-last week  Lifestyle  . Physical activity:    Days per week: Not on file    Minutes per session: Not on  file  . Stress: Not on file  Relationships  . Social connections:    Talks on phone: Not on file    Gets together: Not on file    Attends religious service: Not on file    Active member of club or organization: Not on file    Attends meetings of clubs or organizations: Not on file    Relationship status: Not on file  Other Topics Concern  . Not on file  Social History Narrative  . Not on file    Family History: Family History  Problem Relation Age of Onset  . Hypertension Mother     Allergies: Allergies  Allergen Reactions  . Beef-Derived Products Itching and Nausea And Vomiting    Medications Prior to Admission  Medication Sig Dispense Refill Last Dose  . ferrous sulfate 325 (65 FE) MG tablet Take 1 tablet (325 mg total) by mouth 2 (two) times daily with a meal. 60 tablet 3 Past Week at Unknown time  . Prenatal Vit-Fe Fumarate-FA (PRENATAL MULTIVITAMIN) TABS tablet Take 1 tablet by mouth daily.  Past Week at Unknown time     Review of Systems  All systems reviewed and negative except as stated in HPI  Blood pressure 113/79, pulse 95, temperature 98.7 F (37.1 C), temperature source Oral, resp. rate 18, height 5\' 3"  (1.6 m), weight 68 kg, SpO2 100 %, unknown if currently breastfeeding. General appearance: tired-appearing, NAD Lungs: no respiratory distress Heart: regular rate  Abdomen: soft, non-tender; gravid  Pelvic: deferred Extremities: no significant LE edema  Presentation: cephalic by sutures on RN check Fetal monitoring: 120s/mod/+a/-d Uterine activity: irregular every 3-7 minutes Dilation: 4 Effacement (%): 70 Station: -2 Exam by:: jolynn  Prenatal labs: ABO, Rh: --/--/O POS, O POS Performed at [facility], [address] [place], [place] [address]  [phone](06/01 0810) Antibody: NEG (06/01 0810) Rubella: Immune (06/01 0848) RPR: Nonreactive (01/09 [id number])  HBsAg: Negative (01/09 0000)  HIV: Non-reactive (01/09 0000)  GBS:   PCR negative Glucola:  normal 1-hr Genetic screening:  No results available  Prenatal Transfer Tool  Maternal Diabetes: No Genetic Screening: Declined Maternal Ultrasounds/Referrals: Normal Fetal Ultrasounds or other Referrals:  None Maternal Substance Abuse:  No Significant Maternal Medications:  None Significant Maternal Lab Results: None  Results for orders placed or performed during the hospital encounter of 11/26/18 (from the past 24 hour(s))  Group B strep by PCR   Collection Time: 11/26/18  8:05 AM  Result Value Ref Range   Group B strep by PCR NEGATIVE NEGATIVE  Type and screen [facility]   Collection Time: 11/26/18  8:10 AM  Result Value Ref Range   ABO/RH(D) O POS    Antibody Screen NEG    Sample Expiration      11/29/2018,2359 Performed at [facility], [address] [place], [place] [address]   ABO/Rh   Collection Time: 11/26/18  8:10 AM  Result Value Ref Range   ABO/RH(D)      O POS Performed at [facility], [address] [place], [place] [address]   SARS Coronavirus 2 (CEPHEID - Performed in [facility] hospital [facility]), [facility]   Collection Time: 11/26/18  8:22 AM  Result Value Ref Range   SARS Coronavirus 2 NEGATIVE NEGATIVE  OB RESULTS CONSOLE Rubella Antibody   Collection Time: 11/26/18  8:48 AM  Result Value Ref Range   Rubella Immune   CBC   Collection Time: 11/26/18  9:04 AM  Result Value Ref Range   WBC 7.3 4.0 - 10.5 K/uL   RBC 4.52 3.87 - 5.11 MIL/uL   Hemoglobin 8.0 (L) 12.0 - 15.0 g/dL   HCT 84.128.0 (L) 32.436.0 - 40.146.0 %   MCV 61.9 (L) 80.0 - 100.0 fL   MCH 17.7 (L) 26.0 - 34.0 pg   MCHC 28.6 (L) 30.0 - 36.0 g/dL   RDW 02.717.5 (H) 25.311.5 - 66.415.5 %   Platelets 222 150 - 400 K/uL   nRBC 0.8 (H) 0.0 - 0.2 %    Patient Active Problem List   Diagnosis Date Noted  . Normal labor 11/26/2018  . Advanced Paternal Age 34/07/2016  . Beta thalassemia trait 05/28/2017    Assessment/Plan:  Aletha HalimFarhiya Bye is a 28 y.o. G4P3003 at 4662w0d here for  post-dates induction of labor.   Labor: In early labor, contractions irregular. Will start pitocin to augment/induce labor further since here for post-dates. -- pain control: desires IV pain medications only   Fetal Wellbeing: EFW 7lbs by Leopold's. Cephalic by sutures on RN check.  --  GBS (negative PCR) -- continuous fetal monitoring - category I   Postpartum Planning -- bottle/nexplanon -- RI/[/]Tdap   Gio Janoski S. Earlene Plater, DO OB/GYN Fellow

## 2018-11-26 NOTE — Progress Notes (Signed)
OB/GYN Faculty Practice: Labor Progress Note  Subjective: Doing well, much more uncomfortable. Water broke recently.   Objective: BP 122/80   Pulse 84   Temp 98.2 F (36.8 C) (Oral)   Resp 20   Ht 5\' 3"  (1.6 m)   Wt 68 kg   LMP  (Approximate)   SpO2 100%   BMI 26.57 kg/m  Gen: uncomfortable appearing during contractions. Dilation: 8 Effacement (%):90 Cervical Position: Anterior Station: -1 Presentation: Vertex Exam by:: Rhett Bannister, DO  Assessment and Plan: Heather Stephenson is a 28 y.o. G4P3003 at [redacted]w[redacted]d here for post-dates induction of labor.   Labor: Progressing well with pitocin, recent SROM clear fluid. Anticipate SVD. -- pain control: desires IV pain medications only   Fetal Wellbeing: EFW 7lbs by Leopold's. Cephalic by sutures. -- GBS (negative PCR) -- continuous fetal monitoring - category I - occasional variable  Laurel S. Earlene Plater, DO OB/GYN Fellow, Faculty Practice  1:46 PM

## 2018-11-27 LAB — CBC
HCT: 24.5 % — ABNORMAL LOW (ref 36.0–46.0)
Hemoglobin: 7.1 g/dL — ABNORMAL LOW (ref 12.0–15.0)
MCH: 17.8 pg — ABNORMAL LOW (ref 26.0–34.0)
MCHC: 29 g/dL — ABNORMAL LOW (ref 30.0–36.0)
MCV: 61.4 fL — ABNORMAL LOW (ref 80.0–100.0)
Platelets: 213 10*3/uL (ref 150–400)
RBC: 3.99 MIL/uL (ref 3.87–5.11)
RDW: 17.4 % — ABNORMAL HIGH (ref 11.5–15.5)
WBC: 11.3 10*3/uL — ABNORMAL HIGH (ref 4.0–10.5)
nRBC: 0.4 % — ABNORMAL HIGH (ref 0.0–0.2)

## 2018-11-27 MED ORDER — FERROUS SULFATE 325 (65 FE) MG PO TABS: 325.0000 mg | ORAL_TABLET | Freq: Every day | ORAL | Status: DC

## 2018-11-27 NOTE — Progress Notes (Signed)
Patient requested abdominal binder. Dr. Genia Hotter called and approved order for binder. Provided to patient. Earl Gala, Linda Hedges [place]

## 2018-11-27 NOTE — Progress Notes (Signed)
Post Partum Day 1 Subjective: no complaints, up ad lib, voiding and tolerating PO  Objective: Blood pressure (!) 106/59, pulse 70, temperature 98 F (36.7 C), temperature source Oral, resp. rate 16, height 5\' 3"  (1.6 m), weight 68 kg, SpO2 100 %, unknown if currently breastfeeding. Intake/Output      06/01 0701 - 06/02 0700 06/02 0701 - 06/03 0700   I.V. (mL/kg) 0 (0)    Total Intake(mL/kg) 0 (0)    Blood 609    Total Output 609    Net -609           Physical Exam:  General: alert, cooperative and no distress Lungs: CTAB, no crackles or wheezes Heart: RRR, no m/r/r.  Lochia: appropriate Uterine Fundus: firm DVT Evaluation: No evidence of DVT seen on physical exam. Negative Homan's sign. No cords or calf tenderness. No significant calf/ankle edema.  Recent Labs    11/26/18 0904 11/27/18 0504  HGB 8.0* 7.1*  HCT 28.0* 24.5*    Assessment/Plan: Plan for discharge tomorrow and Contraception - plans for nexplanon (outpatient)  Anemia - f/u H/H in am. Baseline hgb 9-10   LOS: 1 day   Melene Plan 11/27/2018, 8:03 AM

## 2018-11-27 NOTE — Progress Notes (Signed)
CSW received consult for late and limited PNC.  CSW reviewed chart and is screening out consult as it does not meet criteria for automatic CSW involvement and infant drug screening.  MOB started care prior to 28 weeks and had more than 3 visits.   MOB's PTSD was also noted in MR dated for 2008. CSW screening out consult since there is no evidence to support need to address trauma history at this time.   Please contact CSW by MOB's request, if it is noted that history begins to impact patient care, if there are concerns about bonding, or if MOB scores 10 or greater/yes to question 10 on the Edinburgh Postnatal Depression Scale.     Blaine Hamper, MSW, LCSW Clinical Social Work [phone]

## 2018-11-27 NOTE — Plan of Care (Signed)
  Problem: Activity: Goal: Risk for activity intolerance will decrease Note:  Encouraged patient to call for assistance with getting out of bed if she feels dizzy with ambulating due to her low hemoglobin. Earl Gala, Linda Hedges [place]

## 2018-11-28 LAB — HEMOGLOBIN AND HEMATOCRIT, BLOOD
HCT: 22.5 % — ABNORMAL LOW (ref 36.0–46.0)
Hemoglobin: 6.7 g/dL — CL (ref 12.0–15.0)

## 2018-11-28 MED ORDER — IRON 325 (65 FE) MG PO TABS: 1.0000 | ORAL_TABLET | Freq: Two times a day (BID) | ORAL | 0 refills | Status: AC

## 2018-11-28 MED ORDER — IBUPROFEN 600 MG PO TABS: 600.0000 mg | ORAL_TABLET | Freq: Four times a day (QID) | ORAL | 0 refills | Status: AC

## 2018-11-28 NOTE — Progress Notes (Signed)
CRITICAL VALUE ALERT  Critical Value:  Hemoglobin 6.7  Date & Time Notied:  11/28/2018 0715  Provider Notified: Dr. Selena Batten   Orders Received/Actions taken: No new orders at this time; MD to speak with patient and write orders   Lajuana Matte

## 2018-11-28 NOTE — Discharge Instructions (Signed)
Vaginal Delivery, Care After °Refer to this sheet in the next few weeks. These instructions provide you with information about caring for yourself after vaginal delivery. Your health care provider may also give you more specific instructions. Your treatment has been planned according to current medical practices, but problems sometimes occur. Call your health care provider if you have any problems or questions. °What can I expect after the procedure? °After vaginal delivery, it is common to have: °· Some bleeding from your vagina. °· Soreness in your abdomen, your vagina, and the area of skin between your vaginal opening and your anus (perineum). °· Pelvic cramps. °· Fatigue. °Follow these instructions at home: °Medicines °· Take over-the-counter and prescription medicines only as told by your health care provider. °· If you were prescribed an antibiotic medicine, take it as told by your health care provider. Do not stop taking the antibiotic until it is finished. °Driving ° °· Do not drive or operate heavy machinery while taking prescription pain medicine. °· Do not drive for 24 hours if you received a sedative. °Lifestyle °· Do not drink alcohol. This is especially important if you are breastfeeding or taking medicine to relieve pain. °· Do not use tobacco products, including cigarettes, chewing tobacco, or e-cigarettes. If you need help quitting, ask your health care provider. °Eating and drinking °· Drink at least 8 eight-ounce glasses of water every day unless you are told not to by your health care provider. If you choose to breastfeed your baby, you may need to drink more water than this. °· Eat high-fiber foods every day. These foods may help prevent or relieve constipation. High-fiber foods include: °? Whole grain cereals and breads. °? Brown rice. °? Beans. °? Fresh fruits and vegetables. °Activity °· Return to your normal activities as told by your health care provider. Ask your health care provider what  activities are safe for you. °· Rest as much as possible. Try to rest or take a nap when your baby is sleeping. °· Do not lift anything that is heavier than your baby or 10 lb (4.5 kg) until your health care provider says that it is safe. °· Talk with your health care provider about when you can engage in sexual activity. This may depend on your: °? Risk of infection. °? Rate of healing. °? Comfort and desire to engage in sexual activity. °Vaginal Care °· If you have an episiotomy or a vaginal tear, check the area every day for signs of infection. Check for: °? More redness, swelling, or pain. °? More fluid or blood. °? Warmth. °? Pus or a bad smell. °· Do not use tampons or douches until your health care provider says this is safe. °· Watch for any blood clots that may pass from your vagina. These may look like clumps of dark red, brown, or black discharge. °General instructions °· Keep your perineum clean and dry as told by your health care provider. °· Wear loose, comfortable clothing. °· Wipe from front to back when you use the toilet. °· Ask your health care provider if you can shower or take a bath. If you had an episiotomy or a perineal tear during labor and delivery, your health care provider may tell you not to take baths for a certain length of time. °· Wear a bra that supports your breasts and fits you well. °· If possible, have someone help you with household activities and help care for your baby for at least a few days after you   leave the hospital. °· Keep all follow-up visits for you and your baby as told by your health care provider. This is important. °Contact a health care provider if: °· You have: °? Vaginal discharge that has a bad smell. °? Difficulty urinating. °? Pain when urinating. °? A sudden increase or decrease in the frequency of your bowel movements. °? More redness, swelling, or pain around your episiotomy or vaginal tear. °? More fluid or blood coming from your episiotomy or vaginal  tear. °? Pus or a bad smell coming from your episiotomy or vaginal tear. °? A fever. °? A rash. °? Little or no interest in activities you used to enjoy. °? Questions about caring for yourself or your baby. °· Your episiotomy or vaginal tear feels warm to the touch. °· Your episiotomy or vaginal tear is separating or does not appear to be healing. °· Your breasts are painful, hard, or turn red. °· You feel unusually sad or worried. °· You feel nauseous or you vomit. °· You pass large blood clots from your vagina. If you pass a blood clot from your vagina, save it to show to your health care provider. Do not flush blood clots down the toilet without having your health care provider look at them. °· You urinate more than usual. °· You are dizzy or light-headed. °· You have not breastfed at all and you have not had a menstrual period for 12 weeks after delivery. °· You have stopped breastfeeding and you have not had a menstrual period for 12 weeks after you stopped breastfeeding. °Get help right away if: °· You have: °? Pain that does not go away or does not get better with medicine. °? Chest pain. °? Difficulty breathing. °? Blurred vision or spots in your vision. °? Thoughts about hurting yourself or your baby. °· You develop pain in your abdomen or in one of your legs. °· You develop a severe headache. °· You faint. °· You bleed from your vagina so much that you fill two sanitary pads in one hour. °This information is not intended to replace advice given to you by your health care provider. Make sure you discuss any questions you have with your health care provider. °Document Released: 06/10/2000 Document Revised: 11/25/2015 Document Reviewed: 06/28/2015 °[employer] Interactive Patient Education © 2019 [employer] ° °

## 2018-12-01 ENCOUNTER — Encounter (HOSPITAL_COMMUNITY): Payer: Self-pay | Admitting: Family Medicine

## 2018-12-31 ENCOUNTER — Telehealth: Payer: Self-pay | Admitting: Family Medicine

## 2018-12-31 NOTE — Telephone Encounter (Signed)
Attempted to call patient w. [facility] ID # [id number] about her appointment on 7/7 @ 1:55. No answer, interpreter left voicemail instructing patient that the visit is a virtual visit. Patient advised to give the office a call back if needing to reschedule.

## 2019-01-01 ENCOUNTER — Ambulatory Visit: Payer: Medicaid - Out of State

## 2019-01-01 ENCOUNTER — Telehealth: Payer: Self-pay | Admitting: Medical

## 2019-01-01 ENCOUNTER — Encounter: Payer: Self-pay | Admitting: Medical

## 2019-01-01 ENCOUNTER — Other Ambulatory Visit: Payer: Self-pay

## 2019-01-01 DIAGNOSIS — Z91199 Patient's noncompliance with other medical treatment and regimen due to unspecified reason: Secondary | ICD-10-CM

## 2019-01-01 DIAGNOSIS — Z5329 Procedure and treatment not carried out because of patient's decision for other reasons: Secondary | ICD-10-CM

## 2019-01-01 NOTE — Progress Notes (Signed)
@  140pm called patient with som[place] interpreter left a detailed message to call the office back for her postpartum appointment and that this visit is important. @150pm  called back to see if patient would answer from office number, patient did not. Didn't leave another message due to the fact she speaks a different language and interpreter already left a message.

## 2019-01-01 NOTE — Telephone Encounter (Signed)
Mailing the patient a missed appointment reminder. °

## 2019-01-01 NOTE — Progress Notes (Signed)
Unable to reach patient for virtual visit. Will have patient reschedule.  Wende Mott, CNM 01/01/19 2:16 PM
